# Patient Record
Sex: Male | Born: 1965 | Race: White | Hispanic: No | Marital: Married | Smoking: Current every day smoker
Health system: Southern US, Community
[De-identification: ages and names within clinical notes are randomized; demographics above are authoritative.]

## PROBLEM LIST (undated history)

## (undated) DIAGNOSIS — K5792 Diverticulitis of intestine, part unspecified, without perforation or abscess without bleeding: Secondary | ICD-10-CM

## (undated) DIAGNOSIS — K219 Gastro-esophageal reflux disease without esophagitis: Secondary | ICD-10-CM

## (undated) DIAGNOSIS — J449 Chronic obstructive pulmonary disease, unspecified: Secondary | ICD-10-CM

## (undated) DIAGNOSIS — Z860100 Personal history of colon polyps, unspecified: Secondary | ICD-10-CM

## (undated) DIAGNOSIS — J439 Emphysema, unspecified: Secondary | ICD-10-CM

## (undated) DIAGNOSIS — I10 Essential (primary) hypertension: Secondary | ICD-10-CM

## (undated) DIAGNOSIS — I714 Abdominal aortic aneurysm, without rupture: Secondary | ICD-10-CM

## (undated) HISTORY — PX: VASECTOMY: SHX75

## (undated) HISTORY — DX: Emphysema, unspecified: J43.9

## (undated) HISTORY — DX: Personal history of colon polyps, unspecified: Z86.0100

## (undated) HISTORY — PX: ESOPHAGOGASTRODUODENOSCOPY: SHX1529

## (undated) HISTORY — DX: Chronic obstructive pulmonary disease, unspecified: J44.9

## (undated) HISTORY — PX: LAPAROSCOPIC SIGMOID COLECTOMY: SHX5928

## (undated) HISTORY — DX: Essential (primary) hypertension: I10

## (undated) HISTORY — PX: COLECTOMY: SHX59

## (undated) HISTORY — DX: Personal history of colonic polyps: Z86.010

---
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
## 2007-01-29 ENCOUNTER — Ambulatory Visit: Payer: Self-pay | Admitting: Family Medicine

## 2007-01-29 DIAGNOSIS — F17200 Nicotine dependence, unspecified, uncomplicated: Secondary | ICD-10-CM

## 2007-01-29 DIAGNOSIS — K219 Gastro-esophageal reflux disease without esophagitis: Secondary | ICD-10-CM

## 2007-01-29 DIAGNOSIS — E782 Mixed hyperlipidemia: Secondary | ICD-10-CM | POA: Insufficient documentation

## 2007-02-05 ENCOUNTER — Encounter: Payer: Self-pay | Admitting: Family Medicine

## 2007-02-08 ENCOUNTER — Encounter: Payer: Self-pay | Admitting: Family Medicine

## 2007-02-08 LAB — CONVERTED CEMR LAB
ALT: 14 units/L (ref 0–53)
Albumin: 4.4 g/dL (ref 3.5–5.2)
Alkaline Phosphatase: 84 units/L (ref 39–117)
CO2: 20 meq/L (ref 19–32)
Cholesterol: 213 mg/dL — ABNORMAL HIGH (ref 0–200)
Glucose, Bld: 100 mg/dL — ABNORMAL HIGH (ref 70–99)
LDL Cholesterol: 150 mg/dL — ABNORMAL HIGH (ref 0–99)
Potassium: 4.3 meq/L (ref 3.5–5.3)
Sodium: 142 meq/L (ref 135–145)
Total Protein: 7 g/dL (ref 6.0–8.3)
Triglycerides: 134 mg/dL (ref <150)

## 2007-02-12 ENCOUNTER — Telehealth: Payer: Self-pay | Admitting: Family Medicine

## 2011-10-28 HISTORY — PX: COLECTOMY: SHX59

## 2012-04-09 HISTORY — PX: COLONOSCOPY: SHX174

## 2012-04-09 LAB — HM COLONOSCOPY

## 2012-04-13 LAB — HM COLONOSCOPY

## 2015-01-30 ENCOUNTER — Emergency Department (HOSPITAL_BASED_OUTPATIENT_CLINIC_OR_DEPARTMENT_OTHER): Payer: 59

## 2015-01-30 ENCOUNTER — Emergency Department (HOSPITAL_BASED_OUTPATIENT_CLINIC_OR_DEPARTMENT_OTHER)
Admission: EM | Admit: 2015-01-30 | Discharge: 2015-01-31 | Disposition: A | Discharge status: Home / Self Care | Payer: 59 | Attending: Emergency Medicine | Admitting: Emergency Medicine

## 2015-01-30 ENCOUNTER — Encounter (HOSPITAL_BASED_OUTPATIENT_CLINIC_OR_DEPARTMENT_OTHER): Payer: Self-pay

## 2015-01-30 DIAGNOSIS — R067 Sneezing: Secondary | ICD-10-CM | POA: Insufficient documentation

## 2015-01-30 DIAGNOSIS — K219 Gastro-esophageal reflux disease without esophagitis: Secondary | ICD-10-CM | POA: Diagnosis not present

## 2015-01-30 DIAGNOSIS — M549 Dorsalgia, unspecified: Secondary | ICD-10-CM | POA: Insufficient documentation

## 2015-01-30 DIAGNOSIS — R109 Unspecified abdominal pain: Secondary | ICD-10-CM | POA: Insufficient documentation

## 2015-01-30 DIAGNOSIS — Z79899 Other long term (current) drug therapy: Secondary | ICD-10-CM | POA: Diagnosis not present

## 2015-01-30 DIAGNOSIS — Z720 Tobacco use: Secondary | ICD-10-CM | POA: Insufficient documentation

## 2015-01-30 DIAGNOSIS — R05 Cough: Secondary | ICD-10-CM | POA: Insufficient documentation

## 2015-01-30 HISTORY — DX: Diverticulitis of intestine, part unspecified, without perforation or abscess without bleeding: K57.92

## 2015-01-30 HISTORY — DX: Gastro-esophageal reflux disease without esophagitis: K21.9

## 2015-01-30 LAB — URINALYSIS, ROUTINE W REFLEX MICROSCOPIC
Bilirubin Urine: NEGATIVE
GLUCOSE, UA: NEGATIVE mg/dL
Hgb urine dipstick: NEGATIVE
Ketones, ur: NEGATIVE mg/dL
LEUKOCYTES UA: NEGATIVE
Nitrite: NEGATIVE
Protein, ur: NEGATIVE mg/dL
Specific Gravity, Urine: 1.025 (ref 1.005–1.030)
UROBILINOGEN UA: 1 mg/dL (ref 0.0–1.0)
pH: 6 (ref 5.0–8.0)

## 2015-01-30 MED ORDER — IBUPROFEN 800 MG PO TABS
800.0000 mg | ORAL_TABLET | Freq: Once | ORAL | Status: AC
  Administered 2015-01-30: 800 mg via ORAL
  Filled 2015-01-30: qty 1

## 2015-01-30 NOTE — ED Notes (Signed)
Unable to void at this time.

## 2015-01-30 NOTE — ED Notes (Signed)
Patient reports right flank pain which began last night.  Reports previous to this he began having problems with his allergies which started last Thursday.  Patient reports he was coughing and began having pain in the left flank but this has since moved to his right side.  Patient reports difficulty with urination.  Denies painful urination.

## 2015-01-30 NOTE — ED Notes (Addendum)
Left flank pain last week-right flank pain with less UO x 2 days-denies n/v/d

## 2015-01-30 NOTE — ED Provider Notes (Signed)
CSN: [id number]     Arrival date & time 01/30/15  1949 History  This chart was scribed for Delora Fuel, MD by Tula Nakayama, ED Scribe. This patient was seen in room MH07/MH07 and the patient's care was started at 11:21 PM.    Chief Complaint  Patient presents with  . Flank Pain   The history is provided by the patient. No language interpreter was used.    HPI Comments: Cody Flores is a 49 y.o. male who presents to the Emergency Department complaining of intermittent, gradually worsening, 3/10 right flank pain that started 2 days ago. Pt reports productive cough with green phlegm as an associated symptom. He states pain becomes worse with movements, sneezing and coughing. He denies worsening symptoms with deep breaths. Pt has tried Hydrocodone-325 with some relief. Pt reports a similar, shooting pain on his left flank that occurred last week and lasted 3 days. He notes onset of left-sided pain occurred after sneezing multiple times. Pt smokes 1 ppd. He denies nausea, vomiting and dysuria as associated symptoms.  No PCP  Past Medical History  Diagnosis Date  . Diverticulitis   . GERD (gastroesophageal reflux disease)    Past Surgical History  Procedure Laterality Date  . Colectomy    . Vasectomy     No family history on file. History  Substance Use Topics  . Smoking status: Current Every Day Smoker  . Smokeless tobacco: Not on file  . Alcohol Use: No    Review of Systems  Respiratory: Positive for cough.   Gastrointestinal: Negative for nausea and vomiting.  Genitourinary: Positive for flank pain. Negative for dysuria.  All other systems reviewed and are negative.   Allergies  Review of patient's allergies indicates no known allergies.  Home Medications   Prior to Admission medications   Medication Sig Start Date End Date Taking? Authorizing Provider  Lansoprazole (PREVACID PO) Take by mouth.   Yes Historical Provider, MD   BP 162/76 mmHg  Pulse 58  Temp(Src) 97.9  F (36.6 C) (Oral)  Resp 18  Ht 5\' 10"  (1.778 m)  Wt 200 lb (90.719 kg)  BMI 28.70 kg/m2  SpO2 96% Physical Exam  Constitutional: He is oriented to person, place, and time. He appears well-developed and well-nourished. No distress.  HENT:  Head: Normocephalic and atraumatic.  Eyes: Conjunctivae and EOM are normal. Pupils are equal, round, and reactive to light.  Neck: Normal range of motion. Neck supple. No JVD present.  Cardiovascular: Normal rate, regular rhythm and normal heart sounds.   No murmur heard. Pulmonary/Chest: Effort normal and breath sounds normal. No stridor. He has no wheezes. He has no rales. He exhibits tenderness.  Mild tenderness right lower rib cage posteriorly  Abdominal: Soft. Bowel sounds are normal. He exhibits no distension and no mass. There is no tenderness.  Musculoskeletal: Normal range of motion. He exhibits no edema.  Lymphadenopathy:    He has no cervical adenopathy.  Neurological: He is alert and oriented to person, place, and time. No cranial nerve deficit. He exhibits normal muscle tone. Coordination normal.  Skin: Skin is warm and dry. No rash noted.  Psychiatric: He has a normal mood and affect. His behavior is normal. Judgment and thought content normal.  Nursing note and vitals reviewed.   ED Course  Procedures   DIAGNOSTIC STUDIES: Oxygen Saturation is 96% on RA, normal by my interpretation.    COORDINATION OF CARE: 11:25 PM Discussed UA results. Discussed treatment plan with pt which includes chest  x-ray. He agreed to plan.   Labs Review Results for orders placed or performed during the hospital encounter of 01/30/15  Urinalysis, Routine w reflex microscopic  Result Value Ref Range   Color, Urine YELLOW YELLOW   APPearance CLEAR CLEAR   Specific Gravity, Urine 1.025 1.005 - 1.030   pH 6.0 5.0 - 8.0   Glucose, UA NEGATIVE NEGATIVE mg/dL   Hgb urine dipstick NEGATIVE NEGATIVE   Bilirubin Urine NEGATIVE NEGATIVE   Ketones, ur  NEGATIVE NEGATIVE mg/dL   Protein, ur NEGATIVE NEGATIVE mg/dL   Urobilinogen, UA 1.0 0.0 - 1.0 mg/dL   Nitrite NEGATIVE NEGATIVE   Leukocytes, UA NEGATIVE NEGATIVE   Imaging Review Dg Chest 2 View  01/30/2015   CLINICAL DATA:  Right flank pain.  EXAM: CHEST  2 VIEW  COMPARISON:  None.  FINDINGS: The heart size and mediastinal contours are within normal limits. Both lungs are clear. The visualized skeletal structures are unremarkable.  IMPRESSION: No active cardiopulmonary disease.   Electronically Signed   By: Lucienne Capers M.D.   On: 01/30/2015 23:57     MDM   Final diagnoses:  Right flank pain    Right flank pain which appears to be musculoskeletal. I suspect injury from harsh coughing. Chest x-ray is unremarkable and urinalysis is normal. He is dischargedwith a prescription for hydrocodone-acetaminophen to take as needed for pain and return to ED if symptoms worsen. Advised to use over-the-counter NSAIDs as first-line treatment.  I personally performed the services described in this documentation, which was scribed in my presence. The recorded information has been reviewed and is accurate.     Delora Fuel, MD 47/34/03 [id number]

## 2015-01-31 MED ORDER — HYDROCODONE-ACETAMINOPHEN 5-325 MG PO TABS: 1.0000 | ORAL_TABLET | ORAL | Status: DC | PRN

## 2015-01-31 NOTE — Discharge Instructions (Signed)
Take ibuprofen or naproxen for less severe pain. Return if symptoms are getting worse.  Acetaminophen; Hydrocodone tablets or capsules What is this medicine? ACETAMINOPHEN; HYDROCODONE (a set a MEE noe fen; hye droe KOE done) is a pain reliever. It is used to treat mild to moderate pain. This medicine may be used for other purposes; ask your health care provider or pharmacist if you have questions. COMMON BRAND NAME(S): Anexsia, Bancap HC, Ceta-Plus, Co-Gesic, Comfortpak, Dolagesic, [employer], DuoCet, Hydrocet, Hydrogesic, [place], Lorcet HD, Lorcet Plus, Lortab, Margesic H, Maxidone, [place], Polygesic, [place], [place], [place], Vicodin, Vicodin ES, Vicodin HP, Charlane Ferretti What should I tell my health care provider before I take this medicine? They need to know if you have any of these conditions: -brain tumor -Crohn's disease, inflammatory bowel disease, or ulcerative colitis -drug abuse or addiction -head injury -heart or circulation problems -if you often drink alcohol -kidney disease or problems going to the bathroom -liver disease -lung disease, asthma, or breathing problems -an unusual or allergic reaction to acetaminophen, hydrocodone, other opioid analgesics, other medicines, foods, dyes, or preservatives -pregnant or trying to get pregnant -breast-feeding How should I use this medicine? Take this medicine by mouth. Swallow it with a full glass of water. Follow the directions on the prescription label. If the medicine upsets your stomach, take the medicine with food or milk. Do not take more than you are told to take. Talk to your pediatrician regarding the use of this medicine in children. This medicine is not approved for use in children. Overdosage: If you think you have taken too much of this medicine contact a poison control center or emergency room at once. NOTE: This medicine is only for you. Do not share this medicine with others. What if I miss a dose? If you miss  a dose, take it as soon as you can. If it is almost time for your next dose, take only that dose. Do not take double or extra doses. What may interact with this medicine? -alcohol -antihistamines -isoniazid -medicines for depression, anxiety, or psychotic disturbances -medicines for sleep -muscle relaxants -naltrexone -narcotic medicines (opiates) for pain -phenobarbital -ritonavir -tramadol This list may not describe all possible interactions. Give your health care provider a list of all the medicines, herbs, non-prescription drugs, or dietary supplements you use. Also tell them if you smoke, drink alcohol, or use illegal drugs. Some items may interact with your medicine. What should I watch for while using this medicine? Tell your doctor or health care professional if your pain does not go away, if it gets worse, or if you have new or a different type of pain. You may develop tolerance to the medicine. Tolerance means that you will need a higher dose of the medicine for pain relief. Tolerance is normal and is expected if you take the medicine for a long time. Do not suddenly stop taking your medicine because you may develop a severe reaction. Your body becomes used to the medicine. This does NOT mean you are addicted. Addiction is a behavior related to getting and using a drug for a non-medical reason. If you have pain, you have a medical reason to take pain medicine. Your doctor will tell you how much medicine to take. If your doctor wants you to stop the medicine, the dose will be slowly lowered over time to avoid any side effects. You may get drowsy or dizzy when you first start taking the medicine or change doses. Do not drive, use machinery, or do  anything that may be dangerous until you know how the medicine affects you. Stand or sit up slowly. There are different types of narcotic medicines (opiates) for pain. If you take more than one type at the same time, you may have more side effects.  Give your health care provider a list of all medicines you use. Your doctor will tell you how much medicine to take. Do not take more medicine than directed. Call emergency for help if you have problems breathing. The medicine will cause constipation. Try to have a bowel movement at least every 2 to 3 days. If you do not have a bowel movement for 3 days, call your doctor or health care professional. Too much acetaminophen can be very dangerous. Do not take Tylenol (acetaminophen) or medicines that contain acetaminophen with this medicine. Many non-prescription medicines contain acetaminophen. Always read the labels carefully. What side effects may I notice from receiving this medicine? Side effects that you should report to your doctor or health care professional as soon as possible: -allergic reactions like skin rash, itching or hives, swelling of the face, lips, or tongue -breathing problems -confusion -feeling faint or lightheaded, falls -stomach pain -yellowing of the eyes or skin Side effects that usually do not require medical attention (report to your doctor or health care professional if they continue or are bothersome): -nausea, vomiting -stomach upset This list may not describe all possible side effects. Call your doctor for medical advice about side effects. You may report side effects to FDA at 1-800-FDA-1088. Where should I keep my medicine? Keep out of the reach of children. This medicine can be abused. Keep your medicine in a safe place to protect it from theft. Do not share this medicine with anyone. Selling or giving away this medicine is dangerous and against the law. Store at room temperature between 15 and 30 degrees C (59 and 86 degrees F). Protect from light. Keep container tightly closed. Throw away any unused medicine after the expiration date. Discard unused medicine and used packaging carefully. Pets and children can be harmed if they find used or lost packages. NOTE:  This sheet is a summary. It may not cover all possible information. If you have questions about this medicine, talk to your doctor, pharmacist, or health care provider.  2015, Elsevier/Gold Standard. (2013-06-06 13:15:56)

## 2016-01-03 ENCOUNTER — Encounter: Payer: Self-pay | Admitting: Family Medicine

## 2016-01-03 ENCOUNTER — Ambulatory Visit (INDEPENDENT_AMBULATORY_CARE_PROVIDER_SITE_OTHER): Payer: 59 | Admitting: Family Medicine

## 2016-01-03 VITALS — BP 141/81 | HR 57 | Ht 69.0 in | Wt 207.0 lb

## 2016-01-03 DIAGNOSIS — E669 Obesity, unspecified: Secondary | ICD-10-CM | POA: Diagnosis not present

## 2016-01-03 DIAGNOSIS — Z23 Encounter for immunization: Secondary | ICD-10-CM | POA: Diagnosis not present

## 2016-01-03 DIAGNOSIS — Z9049 Acquired absence of other specified parts of digestive tract: Secondary | ICD-10-CM | POA: Insufficient documentation

## 2016-01-03 DIAGNOSIS — E782 Mixed hyperlipidemia: Secondary | ICD-10-CM

## 2016-01-03 DIAGNOSIS — J449 Chronic obstructive pulmonary disease, unspecified: Secondary | ICD-10-CM | POA: Insufficient documentation

## 2016-01-03 DIAGNOSIS — J439 Emphysema, unspecified: Secondary | ICD-10-CM | POA: Diagnosis not present

## 2016-01-03 DIAGNOSIS — R0602 Shortness of breath: Secondary | ICD-10-CM | POA: Diagnosis not present

## 2016-01-03 DIAGNOSIS — F17200 Nicotine dependence, unspecified, uncomplicated: Secondary | ICD-10-CM

## 2016-01-03 DIAGNOSIS — L853 Xerosis cutis: Secondary | ICD-10-CM | POA: Insufficient documentation

## 2016-01-03 DIAGNOSIS — Z1211 Encounter for screening for malignant neoplasm of colon: Secondary | ICD-10-CM

## 2016-01-03 DIAGNOSIS — L821 Other seborrheic keratosis: Secondary | ICD-10-CM

## 2016-01-03 LAB — LIPID PANEL
CHOL/HDL RATIO: 6 ratio — AB (ref <=5.0)
CHOLESTEROL: 217 mg/dL — AB (ref 125–200)
HDL: 36 mg/dL — AB (ref >=40)
LDL Cholesterol: 160 mg/dL — ABNORMAL HIGH (ref <130)
TRIGLYCERIDES: 107 mg/dL (ref <150)
VLDL: 21 mg/dL (ref <30)

## 2016-01-03 LAB — COMPREHENSIVE METABOLIC PANEL
ALBUMIN: 4.4 g/dL (ref 3.6–5.1)
ALK PHOS: 69 U/L (ref 40–115)
ALT: 18 U/L (ref 9–46)
AST: 18 U/L (ref 10–35)
BILIRUBIN TOTAL: 0.5 mg/dL (ref 0.2–1.2)
BUN: 19 mg/dL (ref 7–25)
CALCIUM: 9.4 mg/dL (ref 8.6–10.3)
CO2: 26 mmol/L (ref 20–31)
Chloride: 100 mmol/L (ref 98–110)
Creat: 1.06 mg/dL (ref 0.70–1.33)
GLUCOSE: 98 mg/dL (ref 65–99)
POTASSIUM: 4.1 mmol/L (ref 3.5–5.3)
Sodium: 137 mmol/L (ref 135–146)
TOTAL PROTEIN: 7 g/dL (ref 6.1–8.1)

## 2016-01-03 LAB — CBC
HEMATOCRIT: 46.1 % (ref 39.0–52.0)
HEMOGLOBIN: 15.3 g/dL (ref 13.0–17.0)
MCH: 28.1 pg (ref 26.0–34.0)
MCHC: 33.2 g/dL (ref 30.0–36.0)
MCV: 84.7 fL (ref 78.0–100.0)
MPV: 10 fL (ref 8.6–12.4)
Platelets: 237 10*3/uL (ref 150–400)
RBC: 5.44 MIL/uL (ref 4.22–5.81)
RDW: 14.5 % (ref 11.5–15.5)
WBC: 6.2 10*3/uL (ref 4.0–10.5)

## 2016-01-03 LAB — TSH: TSH: 1.45 mIU/L (ref 0.40–4.50)

## 2016-01-03 MED ORDER — UMECLIDINIUM-VILANTEROL 62.5-25 MCG/INH IN AEPB: 1.0000 | INHALATION_SPRAY | Freq: Every day | RESPIRATORY_TRACT | Status: DC

## 2016-01-03 MED ORDER — ALBUTEROL SULFATE (2.5 MG/3ML) 0.083% IN NEBU
2.5000 mg | INHALATION_SOLUTION | Freq: Once | RESPIRATORY_TRACT | Status: AC
  Administered 2016-01-03: 2.5 mg via RESPIRATORY_TRACT

## 2016-01-03 MED ORDER — VARENICLINE TARTRATE 0.5 MG X 11 & 1 MG X 42 PO MISC: ORAL | Status: DC

## 2016-01-03 MED ORDER — TRIAMCINOLONE ACETONIDE 0.1 % EX CREA: 1.0000 "application " | TOPICAL_CREAM | Freq: Two times a day (BID) | CUTANEOUS | Status: DC

## 2016-01-03 MED FILL — CHANTIX STARTING MONTH BOX: 0.5 MG X 11 | 28 days supply | Qty: 53 | Fill #0

## 2016-01-03 MED FILL — TRIAMCINOLONE 0.1% CREAM: 0.1 | 30 days supply | Qty: 454 | Fill #0

## 2016-01-03 MED FILL — ANORO ELLIPTA 62.5-25 MCG I: 62.5-25 | 30 days supply | Qty: 60 | Fill #0

## 2016-01-03 NOTE — Progress Notes (Signed)
Cody Flores is a 50 y.o. male who presents to [facility] today for establish care and discuss shortness of breath smoking colon cancer screening calf soreness and lesion on left calf.  1) shortness of breath: Patient is a current smoker and has been for many years. He has attempted to quit smoking in the past with patches that did not work. He is very eager and willing to quit smoking. His father has terrible end-stage COPD and he would like to avoid that fate. Patient notes that he has some shortness of breath with exertion. He denies any chest pain or palpitations with this. He has difficulty climbing more than 2 or 3 flights of stairs without stopping. He does note some wheezing and coughing regularly. Today his lungs feel about as well as they normally do.  2) colonoscopy: Patient has a history of diverticulitis and had a laparoscopic sigmoid colectomy. He notes that he is due for his repeat colonoscopy. He feels well with no GI upset diarrhea or blood in stool. His last colonoscopy was done somewhere here near [place]. Currently he has to [facility] and he would like it done at a [place] group practice.  3) dry skin: Patient notes dry skin chronically. He does not use much treatment for this. He notes is mildly bothersome.  4) skin lesion left posterior calf present for a year. He notes a dry scab. It does not bleed and itch or hurt her as painful. It is not changing.   Past Medical History  Diagnosis Date  . Diverticulitis    Past Surgical History  Procedure Laterality Date  . Laparoscopic sigmoid colectomy     Social History  Substance Use Topics  . Smoking status: Current Every Day Smoker  . Smokeless tobacco: Not on file  . Alcohol Use: 0.0 oz/week    0 Standard drinks or equivalent per week   family history includes Aneurysm in his father;  Cancer in his father; Diabetes in his maternal grandmother.  ROS as above Medications: Current Outpatient Prescriptions  Medication Sig Dispense Refill  . lansoprazole (PREVACID) 30 MG capsule Take 30 mg by mouth daily at 12 noon.    . varenicline (CHANTIX STARTING MONTH PAK) 0.5 MG X 11 & 1 MG X 42 tablet Take one 0.5mg  tablet by mouth once daily for 3 days, then increase to one 0.5mg  tablet twice daily for 3 days, then increase to one 1mg  tablet twice daily. 53 tablet 0   No current facility-administered medications for this visit.   No Known Allergies   Exam:  BP 141/81 mmHg  Pulse 57  Ht 5\' 9"  (1.753 m)  Wt 207 lb (93.895 kg)  BMI 30.55 kg/m2 Gen: Well NAD HEENT: EOMI,  MMM Lungs: Normal work of breathing. CTABL Heart: RRR no MRG Abd: NABS, Soft. Nondistended, Nontender Exts: Brisk capillary refill, warm and well perfused. Calves are nontender. Pulses capillary refill sensation intact distally. Skin: Dry occasional erythematous macular rash across trunk. Nontender. Blanchable. He has 1 macular hyperpigmented bruised appearing area on his left back to its been chronic and present for years. On his left posterior calf he has a raised dry scaly small less than 1 cm lesion consistent with a dry seborrheic keratosis.   Spirometry:    PFT INTERPRETATION  FEV1/FVC >70 *AND*  FEV1 >80% predicted  = NORMAL SPIROMETRY NORMAL? no  1. Valid study? yes 2. 3. FEV1/FVC: 53%  >70 =  Normal  <70 = Obstructive    = COPD yes 3. Severity of Obstruction ~ Post-Bronchodilator FEV1: 62% of pred  FEV1 80+ = GOLD Stage I = Mild  FEV1 50-79 = GOLD Stage II = Moderate  FEV1 30-49 = GOLD Stage III = Severe  FEV1 <30 = GOLD Stage IV = Very Severe 5. Bronchodilator Challenge  Increase FEV1 or FVC by 200+mL? no *AND*  Increased same FEV1 or FVC by 12+%? no  Reversible? no 6. FVC <80% = Restrictive no 7. Lung Volumes  TLC, VC, RV Normal = 80-120%  TLC <80% = Restrictive  TLC >120% =  Hyperinflation  RV >120% = Air Trapping  RV <80% = RLD or OLD  VC <80% = RLD or OLD 8. Diffusion Capacity - refer to Pulm needed? no If suspect interstitial lung disease   No results found for this or any previous visit (from the past 24 hour(s)). No results found.   Please see individual assessment and plan sections.

## 2016-01-03 NOTE — Assessment & Plan Note (Signed)
We had a lengthy discussion about options. Plan to try Chantix. Recheck in one month.

## 2016-01-03 NOTE — Assessment & Plan Note (Signed)
Check lipids 

## 2016-01-03 NOTE — Patient Instructions (Addendum)
Thank you for coming in today. You should hear from [facility] soon about the colonscopy.  Start Chantix to quit smoking.  You can set a quit date or use the gradual quit method.  Return in 1 month or so.  Chantix works best when it is taken for at least 3 months.  Get fasting labs today.  Use the cream twice daily as needed.  I think you have COPD.  This is lung damage from smoking.  The most important thing you can do for your lungs is to quit smoking.  Use the inhaler daily.    Chronic Obstructive Pulmonary Disease Chronic obstructive pulmonary disease (COPD) is a common lung condition in which airflow from the lungs is limited. COPD is a general term that can be used to describe many different lung problems that limit airflow, including both chronic bronchitis and emphysema. If you have COPD, your lung function will probably never return to normal, but there are measures you can take to improve lung function and make yourself feel better. CAUSES   Smoking (common).  Exposure to secondhand smoke.  Genetic problems.  Chronic inflammatory lung diseases or recurrent infections. SYMPTOMS  Shortness of breath, especially with physical activity.  Deep, persistent (chronic) cough with a large amount of thick mucus.  Wheezing.  Rapid breaths (tachypnea).  Gray or bluish discoloration (cyanosis) of the skin, especially in your fingers, toes, or lips.  Fatigue.  Weight loss.  Frequent infections or episodes when breathing symptoms become much worse (exacerbations).  Chest tightness. DIAGNOSIS Your health care provider will take a medical history and perform a physical examination to diagnose COPD. Additional tests for COPD may include:  Lung (pulmonary) function tests.  Chest X-ray.  CT scan.  Blood tests. TREATMENT  Treatment for COPD may include:  Inhaler and nebulizer medicines. These help manage the symptoms of COPD and make your breathing more  comfortable.  Supplemental oxygen. Supplemental oxygen is only helpful if you have a low oxygen level in your blood.  Exercise and physical activity. These are beneficial for nearly all people with COPD.  Lung surgery or transplant.  Nutrition therapy to gain weight, if you are underweight.  Pulmonary rehabilitation. This may involve working with a team of health care providers and specialists, such as respiratory, occupational, and physical therapists. HOME CARE INSTRUCTIONS  Take all medicines (inhaled or pills) as directed by your health care provider.  Avoid over-the-counter medicines or cough syrups that dry up your airway (such as antihistamines) and slow down the elimination of secretions unless instructed otherwise by your health care provider.  If you are a smoker, the most important thing that you can do is stop smoking. Continuing to smoke will cause further lung damage and breathing trouble. Ask your health care provider for help with quitting smoking. He or she can direct you to community resources or hospitals that provide support.  Avoid exposure to irritants such as smoke, chemicals, and fumes that aggravate your breathing.  Use oxygen therapy and pulmonary rehabilitation if directed by your health care provider. If you require home oxygen therapy, ask your health care provider whether you should purchase a pulse oximeter to measure your oxygen level at home.  Avoid contact with individuals who have a contagious illness.  Avoid extreme temperature and humidity changes.  Eat healthy foods. Eating smaller, more frequent meals and resting before meals may help you maintain your strength.  Stay active, but balance activity with periods of rest. Exercise and physical activity  will help you maintain your ability to do things you want to do.  Preventing infection and hospitalization is very important when you have COPD. Make sure to receive all the vaccines your health care  provider recommends, especially the pneumococcal and influenza vaccines. Ask your health care provider whether you need a pneumonia vaccine.  Learn and use relaxation techniques to manage stress.  Learn and use controlled breathing techniques as directed by your health care provider. Controlled breathing techniques include:  Pursed lip breathing. Start by breathing in (inhaling) through your nose for 1 second. Then, purse your lips as if you were going to whistle and breathe out (exhale) through the pursed lips for 2 seconds.  Diaphragmatic breathing. Start by putting one hand on your abdomen just above your waist. Inhale slowly through your nose. The hand on your abdomen should move out. Then purse your lips and exhale slowly. You should be able to feel the hand on your abdomen moving in as you exhale.  Learn and use controlled coughing to clear mucus from your lungs. Controlled coughing is a series of short, progressive coughs. The steps of controlled coughing are: 1. Lean your head slightly forward. 2. Breathe in deeply using diaphragmatic breathing. 3. Try to hold your breath for 3 seconds. 4. Keep your mouth slightly open while coughing twice. 5. Spit any mucus out into a tissue. 6. Rest and repeat the steps once or twice as needed. SEEK MEDICAL CARE IF:  You are coughing up more mucus than usual.  There is a change in the color or thickness of your mucus.  Your breathing is more labored than usual.  Your breathing is faster than usual. SEEK IMMEDIATE MEDICAL CARE IF:  You have shortness of breath while you are resting.  You have shortness of breath that prevents you from:  Being able to talk.  Performing your usual physical activities.  You have chest pain lasting longer than 5 minutes.  Your skin color is more cyanotic than usual.  You measure low oxygen saturations for longer than 5 minutes with a pulse oximeter. MAKE SURE YOU:  Understand these  instructions.  Will watch your condition.  Will get help right away if you are not doing well or get worse.   This information is not intended to replace advice given to you by your health care provider. Make sure you discuss any questions you have with your health care provider.   Document Released: 07/23/2005 Document Revised: 11/03/2014 Document Reviewed: 06/09/2013 Elsevier Interactive Patient Education [employer].

## 2016-01-03 NOTE — Assessment & Plan Note (Signed)
Dry skin treat with triamcinolone compounded with Eucerin as needed.

## 2016-01-03 NOTE — Assessment & Plan Note (Signed)
Routine fasting blood work

## 2016-01-03 NOTE — Assessment & Plan Note (Signed)
Gold stage II. Functionally limited.  Plan to start Anoro inhaler.  Recheck in 1 month.  Quit smoking today.

## 2016-01-03 NOTE — Assessment & Plan Note (Signed)
Left posterior calf. Watchful waiting.

## 2016-01-03 NOTE — Assessment & Plan Note (Signed)
Refer to [facility] gastroenterology for colonoscopy.

## 2016-01-04 ENCOUNTER — Encounter: Payer: Self-pay | Admitting: Family Medicine

## 2016-01-04 DIAGNOSIS — E559 Vitamin D deficiency, unspecified: Secondary | ICD-10-CM | POA: Insufficient documentation

## 2016-01-04 DIAGNOSIS — R7303 Prediabetes: Secondary | ICD-10-CM | POA: Insufficient documentation

## 2016-01-04 LAB — HEMOGLOBIN A1C
HEMOGLOBIN A1C: 6.1 % — AB (ref <5.7)
Mean Plasma Glucose: 128 mg/dL — ABNORMAL HIGH (ref <117)

## 2016-01-04 LAB — VITAMIN D 25 HYDROXY (VIT D DEFICIENCY, FRACTURES): VIT D 25 HYDROXY: 17 ng/mL — AB (ref 30–100)

## 2016-01-04 MED ORDER — ATORVASTATIN CALCIUM 20 MG PO TABS: 20.0000 mg | ORAL_TABLET | Freq: Every day | ORAL | Status: DC

## 2016-01-04 NOTE — Progress Notes (Signed)
Quick Note:  Cholesterol is bad. Plan to start lipitor.  Cody Flores has pre-diabetes. We will address diet and weight loss when quit smoking.  Vitamin D deficiency noted. Take 5000 units of vitamin D daily over-the-counter.   ______

## 2016-01-04 NOTE — Addendum Note (Signed)
Addended by: Gregor Hams on: 01/04/2016 07:21 AM   Modules accepted: Orders

## 2016-01-31 ENCOUNTER — Ambulatory Visit (INDEPENDENT_AMBULATORY_CARE_PROVIDER_SITE_OTHER): Payer: 59 | Admitting: Family Medicine

## 2016-01-31 ENCOUNTER — Encounter: Payer: Self-pay | Admitting: Family Medicine

## 2016-01-31 VITALS — BP 127/84 | HR 62 | Wt 211.0 lb

## 2016-01-31 DIAGNOSIS — J439 Emphysema, unspecified: Secondary | ICD-10-CM | POA: Diagnosis not present

## 2016-01-31 DIAGNOSIS — F17200 Nicotine dependence, unspecified, uncomplicated: Secondary | ICD-10-CM

## 2016-01-31 DIAGNOSIS — R7303 Prediabetes: Secondary | ICD-10-CM

## 2016-01-31 DIAGNOSIS — E782 Mixed hyperlipidemia: Secondary | ICD-10-CM | POA: Diagnosis not present

## 2016-01-31 MED ORDER — VARENICLINE TARTRATE 1 MG PO TABS: 1.0000 mg | ORAL_TABLET | Freq: Two times a day (BID) | ORAL | Status: DC

## 2016-01-31 MED FILL — CHANTIX 1 MG TABLET: 1 | 30 days supply | Qty: 60 | Fill #0

## 2016-01-31 NOTE — Patient Instructions (Signed)
Thank you for coming in today. Call [facility] about colonoscopy [phone] Continue Chantix Start Lipitor Get Anoro refilled.  Return in 2-3 months.   Atorvastatin tablets What is this medicine? ATORVASTATIN (a TORE va sta tin) is known as a HMG-CoA reductase inhibitor or 'statin'. It lowers the level of cholesterol and triglycerides in the blood. This drug may also reduce the risk of heart attack, stroke, or other health problems in patients with risk factors for heart disease. Diet and lifestyle changes are often used with this drug. This medicine may be used for other purposes; ask your health care provider or pharmacist if you have questions. What should I tell my health care provider before I take this medicine? They need to know if you have any of these conditions: -frequently drink alcoholic beverages -history of stroke, TIA -kidney disease -liver disease -muscle aches or weakness -other medical condition -an unusual or allergic reaction to atorvastatin, other medicines, foods, dyes, or preservatives -pregnant or trying to get pregnant -breast-feeding How should I use this medicine? Take this medicine by mouth with a glass of water. Follow the directions on the prescription label. You can take this medicine with or without food. Take your doses at regular intervals. Do not take your medicine more often than directed. Talk to your pediatrician regarding the use of this medicine in children. While this drug may be prescribed for children as young as 94 years old for selected conditions, precautions do apply. Overdosage: If you think you have taken too much of this medicine contact a poison control center or emergency room at once. NOTE: This medicine is only for you. Do not share this medicine with others. What if I miss a dose? If you miss a dose, take it as soon as you can. If it is almost time for your next dose, take only that dose. Do not take double or extra doses. What may  interact with this medicine? Do not take this medicine with any of the following medications: -red yeast rice -telaprevir -telithromycin -voriconazole This medicine may also interact with the following medications: -alcohol -antiviral medicines for HIV or AIDS -boceprevir -certain antibiotics like clarithromycin, erythromycin, troleandomycin -certain medicines for cholesterol like fenofibrate or gemfibrozil -cimetidine -clarithromycin -colchicine -cyclosporine -digoxin -male hormones, like estrogens or progestins and birth control pills -grapefruit juice -medicines for fungal infections like fluconazole, itraconazole, ketoconazole -niacin -rifampin -spironolactone This list may not describe all possible interactions. Give your health care provider a list of all the medicines, herbs, non-prescription drugs, or dietary supplements you use. Also tell them if you smoke, drink alcohol, or use illegal drugs. Some items may interact with your medicine. What should I watch for while using this medicine? Visit your doctor or health care professional for regular check-ups. You may need regular tests to make sure your liver is working properly. Tell your doctor or health care professional right away if you get any unexplained muscle pain, tenderness, or weakness, especially if you also have a fever and tiredness. Your doctor or health care professional may tell you to stop taking this medicine if you develop muscle problems. If your muscle problems do not go away after stopping this medicine, contact your health care professional. This drug is only part of a total heart-health program. Your doctor or a dietician can suggest a low-cholesterol and low-fat diet to help. Avoid alcohol and smoking, and keep a proper exercise schedule. Do not use this drug if you are pregnant or breast-feeding. Serious side effects to  an unborn child or to an infant are possible. Talk to your doctor or pharmacist for  more information. This medicine may affect blood sugar levels. If you have diabetes, check with your doctor or health care professional before you change your diet or the dose of your diabetic medicine. If you are going to have surgery tell your health care professional that you are taking this drug. What side effects may I notice from receiving this medicine? Side effects that you should report to your doctor or health care professional as soon as possible: -allergic reactions like skin rash, itching or hives, swelling of the face, lips, or tongue -dark urine -fever -joint pain -muscle cramps, pain -redness, blistering, peeling or loosening of the skin, including inside the mouth -trouble passing urine or change in the amount of urine -unusually weak or tired -yellowing of eyes or skin Side effects that usually do not require medical attention (report to your doctor or health care professional if they continue or are bothersome): -constipation -heartburn -stomach gas, pain, upset This list may not describe all possible side effects. Call your doctor for medical advice about side effects. You may report side effects to FDA at 1-800-FDA-1088. Where should I keep my medicine? Keep out of the reach of children. Store at room temperature between 20 to 25 degrees C (68 to 77 degrees F). Throw away any unused medicine after the expiration date. NOTE: This sheet is a summary. It may not cover all possible information. If you have questions about this medicine, talk to your doctor, pharmacist, or health care provider.    2016, Elsevier/Gold Standard. (2011-09-02 FT:[id number])

## 2016-01-31 NOTE — Assessment & Plan Note (Signed)
-  Continue Chantix

## 2016-01-31 NOTE — Assessment & Plan Note (Signed)
Continue the Anoro.   Continue smoking cessation.  Return in a few months.

## 2016-01-31 NOTE — Progress Notes (Signed)
       Cody Flores is a 50 y.o. male who presents to [place] today for cholesterol, smoking.   patient was seen last month where he was diagnosed with COPD. In the interim he has been using the Anoro inhaler and feels great. He notes increased exercise tolerance. No wheezing or shortness of breath.   He has not yet started the Lipitor as he does not think he was notified.  Additionally he continues to smoke but has reduced his smoking on Chantix. He tolerates the Chantix well.    Past Medical History  Diagnosis Date  . Diverticulitis    Past Surgical History  Procedure Laterality Date  . Laparoscopic sigmoid colectomy     Social History  Substance Use Topics  . Smoking status: Current Every Day Smoker  . Smokeless tobacco: Not on file  . Alcohol Use: 0.0 oz/week    0 Standard drinks or equivalent per week   family history includes Aneurysm in his father; Cancer in his father; Diabetes in his maternal grandmother.  ROS as above Medications: Current Outpatient Prescriptions  Medication Sig Dispense Refill  . lansoprazole (PREVACID) 30 MG capsule Take 30 mg by mouth daily at 12 noon.    . triamcinolone cream (KENALOG) 0.1 % Apply 1 application topically 2 (two) times daily. 453.6 g 1  . umeclidinium-vilanterol (ANORO ELLIPTA) 62.5-25 MCG/INH AEPB Inhale 1 puff into the lungs daily. 1 each 12  . atorvastatin (LIPITOR) 20 MG tablet Take 1 tablet (20 mg total) by mouth daily. (Patient not taking: Reported on 01/31/2016) 30 tablet 1  . varenicline (CHANTIX) 1 MG tablet Take 1 tablet (1 mg total) by mouth 2 (two) times daily. 60 tablet 3   No current facility-administered medications for this visit.   No Known Allergies   Exam:  BP 127/84 mmHg  Pulse 62  Wt 211 lb (95.709 kg)  SpO2 95% Gen: Well NAD HEENT: EOMI,  MMM Lungs: Normal work of breathing. CTABL Heart: RRR no  MRG Abd: NABS, Soft. Nondistended, Nontender Exts: Brisk capillary refill, warm and well perfused.   No results found for this or any previous visit (from the past 24 hour(s)). No results found.   Please see individual assessment and plan sections.

## 2016-01-31 NOTE — Assessment & Plan Note (Addendum)
Start lipitor.  Recheck in a few month

## 2016-01-31 NOTE — Assessment & Plan Note (Signed)
Diet and weight loss.  

## 2016-02-05 MED FILL — ATORVASTATIN 20 MG TABLET: 20 | 30 days supply | Qty: 30 | Fill #0

## 2016-02-05 MED FILL — ANORO ELLIPTA 62.5-25 MCG I: 62.5-25 | 30 days supply | Qty: 60 | Fill #1

## 2016-02-21 DIAGNOSIS — Z135 Encounter for screening for eye and ear disorders: Secondary | ICD-10-CM | POA: Diagnosis not present

## 2016-02-21 DIAGNOSIS — H524 Presbyopia: Secondary | ICD-10-CM | POA: Diagnosis not present

## 2016-02-21 DIAGNOSIS — Q142 Congenital malformation of optic disc: Secondary | ICD-10-CM | POA: Diagnosis not present

## 2016-02-21 DIAGNOSIS — H5203 Hypermetropia, bilateral: Secondary | ICD-10-CM | POA: Diagnosis not present

## 2016-03-04 ENCOUNTER — Encounter: Payer: Self-pay | Admitting: Family Medicine

## 2016-03-14 MED FILL — CHANTIX 1 MG TABLET: 1 | 30 days supply | Qty: 60 | Fill #1

## 2016-03-14 MED FILL — ANORO ELLIPTA 62.5-25 MCG I: 62.5-25 | 30 days supply | Qty: 60 | Fill #2

## 2016-03-14 MED FILL — ATORVASTATIN 20 MG TABLET: 20 | 30 days supply | Qty: 30 | Fill #1 | Status: TO

## 2016-05-02 MED FILL — ANORO ELLIPTA 62.5-25 MCG I: 62.5-25 | 30 days supply | Qty: 60 | Fill #3 | Status: TO

## 2016-05-08 ENCOUNTER — Ambulatory Visit: Payer: 59 | Admitting: Family Medicine

## 2016-05-15 ENCOUNTER — Encounter: Payer: Self-pay | Admitting: Family Medicine

## 2016-05-15 ENCOUNTER — Ambulatory Visit (INDEPENDENT_AMBULATORY_CARE_PROVIDER_SITE_OTHER): Payer: 59 | Admitting: Family Medicine

## 2016-05-15 ENCOUNTER — Telehealth: Payer: Self-pay | Admitting: Family Medicine

## 2016-05-15 VITALS — BP 142/83 | HR 57 | Wt 211.0 lb

## 2016-05-15 DIAGNOSIS — L858 Other specified epidermal thickening: Secondary | ICD-10-CM | POA: Diagnosis not present

## 2016-05-15 DIAGNOSIS — E785 Hyperlipidemia, unspecified: Secondary | ICD-10-CM

## 2016-05-15 DIAGNOSIS — E669 Obesity, unspecified: Secondary | ICD-10-CM

## 2016-05-15 DIAGNOSIS — E559 Vitamin D deficiency, unspecified: Secondary | ICD-10-CM

## 2016-05-15 DIAGNOSIS — R7303 Prediabetes: Secondary | ICD-10-CM | POA: Diagnosis not present

## 2016-05-15 DIAGNOSIS — J439 Emphysema, unspecified: Secondary | ICD-10-CM

## 2016-05-15 DIAGNOSIS — L570 Actinic keratosis: Secondary | ICD-10-CM | POA: Diagnosis not present

## 2016-05-15 LAB — COMPREHENSIVE METABOLIC PANEL
ALBUMIN: 4.4 g/dL (ref 3.6–5.1)
ALK PHOS: 70 U/L (ref 40–115)
ALT: 21 U/L (ref 9–46)
AST: 18 U/L (ref 10–35)
BILIRUBIN TOTAL: 0.4 mg/dL (ref 0.2–1.2)
BUN: 15 mg/dL (ref 7–25)
CALCIUM: 9.3 mg/dL (ref 8.6–10.3)
CO2: 27 mmol/L (ref 20–31)
Chloride: 103 mmol/L (ref 98–110)
Creat: 1.04 mg/dL (ref 0.70–1.33)
Glucose, Bld: 102 mg/dL — ABNORMAL HIGH (ref 65–99)
Potassium: 4.4 mmol/L (ref 3.5–5.3)
Sodium: 139 mmol/L (ref 135–146)
Total Protein: 7 g/dL (ref 6.1–8.1)

## 2016-05-15 LAB — LIPID PANEL
Cholesterol: 213 mg/dL — ABNORMAL HIGH (ref 125–200)
HDL: 37 mg/dL — AB (ref >=40)
LDL Cholesterol: 153 mg/dL — ABNORMAL HIGH (ref <130)
TRIGLYCERIDES: 114 mg/dL (ref <150)
Total CHOL/HDL Ratio: 5.8 Ratio — ABNORMAL HIGH (ref <=5.0)
VLDL: 23 mg/dL (ref <30)

## 2016-05-15 LAB — CBC
HEMATOCRIT: 45.1 % (ref 38.5–50.0)
HEMOGLOBIN: 15 g/dL (ref 13.2–17.1)
MCH: 29 pg (ref 27.0–33.0)
MCHC: 33.3 g/dL (ref 32.0–36.0)
MCV: 87.2 fL (ref 80.0–100.0)
MPV: 10.2 fL (ref 7.5–12.5)
Platelets: 237 10*3/uL (ref 140–400)
RBC: 5.17 MIL/uL (ref 4.20–5.80)
RDW: 15.1 % — ABNORMAL HIGH (ref 11.0–15.0)
WBC: 7 10*3/uL (ref 3.8–10.8)

## 2016-05-15 MED ORDER — ATORVASTATIN CALCIUM 20 MG PO TABS: 20.0000 mg | ORAL_TABLET | Freq: Every day | ORAL | Status: DC

## 2016-05-15 NOTE — Telephone Encounter (Signed)
Pt states he is using  [employer]

## 2016-05-15 NOTE — Progress Notes (Signed)
Cody Flores is a 50 y.o. male who presents to [place]: [facility] today for follow-up COPD, smoking, hyperlipidemia, and discuss skin lesion.  COPD: Patient was unable to quit smoking as he's had increased life stressors. He does not feel ready to try to another quit attempt at this time. As for his COPD he notes is well-controlled with the below medications. He feels as though he can breathe well.  Hyperlipidemia: Patient tolerates Lipitor well with no chest pains palpitations shortness of breath or muscle soreness.  Lesion: As noted previously patient has a papular lesion on the left posterior calf. This is thought to be a seborrheic dermatitis previously. He notes the skin lesion has changed become more crusty and protruding further from the skin in the interim few months.   Past Medical History  Diagnosis Date  . GERD (gastroesophageal reflux disease)   . Diverticulitis    Past Surgical History  Procedure Laterality Date  . Colectomy    . Vasectomy    . Laparoscopic sigmoid colectomy     Social History  Substance Use Topics  . Smoking status: Current Every Day Smoker  . Smokeless tobacco: Not on file  . Alcohol Use: 0.0 oz/week    0 Standard drinks or equivalent per week   family history includes Aneurysm in his father; Cancer in his father; Diabetes in his maternal grandmother.  ROS as above:  Medications: Current Outpatient Prescriptions  Medication Sig Dispense Refill  . atorvastatin (LIPITOR) 20 MG tablet Take 1 tablet (20 mg total) by mouth daily. 90 tablet 1  . lansoprazole (PREVACID) 30 MG capsule Take 30 mg by mouth daily at 12 noon.    . triamcinolone cream (KENALOG) 0.1 % Apply 1 application topically 2 (two) times daily. 453.6 g 1  . umeclidinium-vilanterol (ANORO ELLIPTA) 62.5-25 MCG/INH AEPB Inhale 1 puff into the lungs daily. 1 each 12    No current facility-administered medications for this visit.   No Known Allergies   Exam:  BP 142/83 mmHg  Pulse 57  Wt 211 lb (95.709 kg)  SpO2 95% Gen: Well NAD HEENT: EOMI,  MMM Lungs: Normal work of breathing. CTABL Heart: RRR no MRG Abd: NABS, Soft. Nondistended, Nontender Exts: Brisk capillary refill, warm and well perfused.  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  Excisional biopsy: Consent obtained and timeout performed. Skin cleaned with chlorhexidine cold spray applied and the wound was numbed with 5 mL of lidocaine achieving good anesthesia. The skin prepped and draped in the usual sterile fashion using chlorhexidine. A sharp incision was made achieving a good elliptical shape approximately 3 x 1.5 cm surrounding the skin lesion with at least 2 mm margins on all side. The skin was undermined initially shallow and deep to the lesion and the skin and lesion was removed. Bleeding was controlled with pressure and 3 Vicryl sutures. Skin edges undermined slightly and the wound edges were closed with 3 horizontal mattress sutures. Sterile dressing applied. Patient tolerated the procedure well.  No results found for this or any previous visit (from the past 24 hour(s)). No results found.    Assessment and Plan: 50 y.o. male with   COPD: Stable. Continue inhalers. Smoking cessation discussed below..  Hyperlipidemia, obesity, prediabetes: Continue current regimen. 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 Excisional biopsy today. Skin pathology pending. Return in 10-14 days for suture removal. Wound  care instructions provided.   Smoking: Patient failed Chantix. He is not ready to try another quit attempt to lie stressors. Will recheck in the near future. Discussed warning signs or symptoms. Please see discharge instructions. Patient expresses understanding.

## 2016-05-15 NOTE — Patient Instructions (Signed)
Thank you for coming in today. Return in 10-14 days for suture removal.  Keep the skin clean and dry.  Return sooner if needed.  Continue medicines.  Get labs today.  We will recheck blood pressure at the follow up.   Sutured Wound Care Sutures are stitches that can be used to close wounds. Taking care of your wound properly can help to prevent pain and infection. It can also help your wound to heal more quickly. HOW TO CARE FOR YOUR SUTURED WOUND Wound Care  Keep the wound clean and dry.  If you were given a bandage (dressing), you should change it at least once per day or as directed by your health care provider. You should also change it if it becomes wet or dirty.  Keep the wound completely dry for the first 24 hours or as directed by your health care provider. After that time, you may shower or bathe. However, make sure that the wound is not soaked in water until the sutures have been removed.  Clean the wound one time each day or as directed by your health care provider.  Wash the wound with soap and water.  Rinse the wound with water to remove all soap.  Pat the wound dry with a clean towel. Do not rub the wound.  Aftercleaning the wound, apply a thin layer of antibioticointment as directed by your health care provider. This will help to prevent infection and keep the dressing from sticking to the wound.  Have the sutures removed as directed by your health care provider. General Instructions  Take or apply medicines only as directed by your health care provider.  To help prevent scarring, make sure to cover your wound with sunscreen whenever you are outside after the sutures are removed and the wound is healed. Make sure to wear a sunscreen of at least 30 SPF.  If you were prescribed an antibiotic medicine or ointment, finish all of it even if you start to feel better.  Do not scratch or pick at the wound.  Keep all follow-up visits as directed by your health care  provider. This is important.  Check your wound every day for signs of infection. Watch for:   Redness, swelling, or pain.  Fluid, blood, or pus.  Raise (elevate) the injured area above the level of your heart while you are sitting or lying down, if possible.  Avoid stretching your wound.  Drink enough fluids to keep your urine clear or pale yellow. SEEK MEDICAL CARE IF:  You received a tetanus shot and you have swelling, severe pain, redness, or bleeding at the injection site.  You have a fever.  A wound that was closed breaks open.  You notice a bad smell coming from the wound.  You notice something coming out of the wound, such as wood or glass.  Your pain is not controlled with medicine.  You have increased redness, swelling, or pain at the site of your wound.  You have fluid, blood, or pus coming from your wound.  You notice a change in the color of your skin near your wound.  You need to change the dressing frequently due to fluid, blood, or pus draining from the wound.  You develop a new rash.  You develop numbness around the wound. SEEK IMMEDIATE MEDICAL CARE IF:  You develop severe swelling around the injury site.  Your pain suddenly increases and is severe.  You develop painful lumps near the wound or on skin that  is anywhere on your body.  You have a red streak going away from your wound.  The wound is on your hand or foot and you cannot properly move a finger or toe.  The wound is on your hand or foot and you notice that your fingers or toes look pale or bluish.   This information is not intended to replace advice given to you by your health care provider. Make sure you discuss any questions you have with your health care provider.   Document Released: 11/20/2004 Document Revised: 02/27/2015 Document Reviewed: 05/25/2013 Elsevier Interactive Patient Education 2016 [employer].   Squamous Cell Carcinoma Squamous cell carcinoma is a common form  of skin cancer. It begins in the squamous cells in the outer layer of the skin (epidermis). It occurs most often in parts of the body that are frequently exposed to the sun, such as the face, lips, neck, arms, legs, and hands. However, this condition can occur anywhere on the body, including the inside of the mouth, sites of long-term (chronic) scarring, and the anus. If squamous cell carcinoma is treated soon enough, it rarely spreads to other areas of the body (metastasizes). If it is not treated, it can destroy nearby tissues. In rare cases, it can spread to other areas. CAUSES This condition is usually caused by exposure to ultraviolet (UV) light. UV light may come from the sun or from tanning beds. Other causes include:  Exposure to arsenic.  Exposure to radiation.  Exposure to toxic tars and oils.  Certain genetic conditions, such as xeroderma pigmentosum. RISK FACTORS This condition is more likely to develop in:  People who are older than 51 years of age.  People who have fair skin (light complexion).  People who have blonde or red hair.  People who have blue, green, or gray eyes.  People who have childhood freckling.  People who have had sun exposure over long periods of time, especially during childhood.  People who have had repeated sunburns.  People who have a weakened immune system.  People who have been exposed to certain chemicals, such as tar, soot, and arsenic.  People who have chronic inflammatory conditions.  People who have chronic infections.  People who have an HPV (human papillomavirus) infection.  People who have conditions that cause chronic scarring. These can include burn scars, chronic ulcers, heat (thermal) injuries, and radiation.  People who have had psoralen and ultraviolet A (PUVA) treatments.  People who smoke.  People who use tanning beds. SYMPTOMS This condition often starts as small pink or brown growths on the skin. The growths have  a rough surface that may feel like sandpaper. In some cases, the growths are easier to feel than to see. The growths may develop into a sore that does not heal. DIAGNOSIS This condition may be diagnosed with:  A physical exam.  Removal of a tissue sample to be examined under a microscope (biopsy). TREATMENT Treatment for this condition involves removing the cancerous tissue. The method that is used for this depends on the size and location of the tumors, as well as your overall health. Possible treatments include:  Mohs surgery. In this procedure, the cancerous skin cells are removed layer by layer until all of the tumor has been removed.  Surgical removal (excision) of the tumor. This involves removing the entire tumor and a small amount of normal skin that surrounds it.  Cryosurgery. This involves freezing the tumor with liquid nitrogen.  Plastic surgery. The tumor is removed,  and healthy skin from another part of the body is used to cover the wound. This may be done for large tumors that are in areas where it is not possible to stretch the nearby skin to sew the edges of the wound together.  Radiation. This may be used for tumors on the face.  Photodynamic therapy. A chemical cream is applied to the skin, and light exposure is used to activate the chemical.  Electrodesiccation and curettage. This involves alternately scraping and burning the tumor while using an electric current to control bleeding. HOME CARE INSTRUCTIONS  Avoid unprotected sun exposure.  Do self-exams as told by your health care provider. Look for new spots or changes in your skin.  Keep all follow-up visits as told by your health care provider. This is important.  Do not use tobacco products, including cigarettes, chewing tobacco, or e-cigarettes. If you need help quitting, ask your health care provider. PREVENTION  Avoid the sun when it is the strongest. This is usually between 10:00 a.m. and 4:00 p.m.  When  you are out in the sun, use a sunscreen that has a sun protection factor (SPF) of at least 47.  Apply sunscreen at least 30 minutes before exposure to the sun.  Reapply sunscreen every 2-4 hours while you are outside. Also reapply it after swimming and after excessive sweating.  Always wear hats, protective clothing, and UV-blocking sunglasses when you are outdoors.  Do not use tanning beds. SEEK MEDICAL CARE IF:  You notice any new growths or any changes in your skin.  You have had a squamous cell carcinoma tumor removed and you notice a new growth in the same location.   This information is not intended to replace advice given to you by your health care provider. Make sure you discuss any questions you have with your health care provider.   Document Released: 04/19/2003 Document Revised: 07/04/2015 Document Reviewed: 02/05/2015 Elsevier Interactive Patient Education [employer].

## 2016-05-16 LAB — VITAMIN D 25 HYDROXY (VIT D DEFICIENCY, FRACTURES): VIT D 25 HYDROXY: 27 ng/mL — AB (ref 30–100)

## 2016-05-29 ENCOUNTER — Ambulatory Visit (INDEPENDENT_AMBULATORY_CARE_PROVIDER_SITE_OTHER): Payer: 59 | Admitting: Family Medicine

## 2016-05-29 ENCOUNTER — Encounter: Payer: Self-pay | Admitting: Family Medicine

## 2016-05-29 VITALS — BP 136/84 | HR 71 | Wt 219.0 lb

## 2016-05-29 DIAGNOSIS — E785 Hyperlipidemia, unspecified: Secondary | ICD-10-CM | POA: Diagnosis not present

## 2016-05-29 DIAGNOSIS — L858 Other specified epidermal thickening: Secondary | ICD-10-CM | POA: Diagnosis not present

## 2016-05-29 NOTE — Patient Instructions (Signed)
Thank you for coming in today. Return in 6 months.  Try to take lipitor regularly.  Work on cutting back smoking.

## 2016-05-29 NOTE — Progress Notes (Signed)
       Cody Flores is a 50 y.o. male who presents to [facility]: [facility] today for  Follow-up for suture removal and follow-up  For hyperlipidemia.   Patient had a skin lesion removed from his calf on July 20th. He is doing well with no significant pain or redness.  She skin lesion came back as an actinic keratosis with clean margins.   Hyperlipidemia: He notes that he has not been taking his Lipitor regularly. He feels well however.    Past Medical History:  Diagnosis Date  . Diverticulitis   . GERD (gastroesophageal reflux disease)    Past Surgical History:  Procedure Laterality Date  . COLECTOMY    . LAPAROSCOPIC SIGMOID COLECTOMY    . VASECTOMY     Social History  Substance Use Topics  . Smoking status: Current Every Day Smoker  . Smokeless tobacco: Not on file  . Alcohol use 0.0 oz/week   family history includes Aneurysm in his father; Cancer in his father; Diabetes in his maternal grandmother.  ROS as above:  Medications: Current Outpatient Prescriptions  Medication Sig Dispense Refill  . atorvastatin (LIPITOR) 20 MG tablet Take 1 tablet (20 mg total) by mouth daily. 90 tablet 1  . lansoprazole (PREVACID) 30 MG capsule Take 30 mg by mouth daily at 12 noon.    . triamcinolone cream (KENALOG) 0.1 % Apply 1 application topically 2 (two) times daily. 453.6 g 1  . umeclidinium-vilanterol (ANORO ELLIPTA) 62.5-25 MCG/INH AEPB Inhale 1 puff into the lungs daily. 1 each 12   No current facility-administered medications for this visit.    No Known Allergies   Exam:  BP 136/84   Pulse 71   Wt 219 lb (99.3 kg)   BMI 32.34 kg/m  Gen: Well NAD HEENT: EOMI,  MMM Lungs: Normal work of breathing. CTABL Heart: RRR no MRG Abd: NABS, Soft. Nondistended, Nontender Exts: Brisk capillary refill, warm and well perfused.  Skin: Well appearing sutured wound left posterior  calf with 3 horizontal mattress sutures  3 sutures removed and Steri-Strips applied  Lab Results  Component Value Date   CHOL 213 (H) 05/15/2016   HDL 37 (L) 05/15/2016   LDLCALC 153 (H) 05/15/2016   TRIG 114 05/15/2016   CHOLHDL 5.8 (H) 05/15/2016     No results found for this or any previous visit (from the past 24 hour(s)). No results found.    Assessment and Plan: 50 y.o. male with  1) well-appearing wound. Sutures removed. Return as needed  2) hyperlipidemia recommend taking Lipitor regularly   No orders of the defined types were placed in this encounter.   Discussed warning signs or symptoms. Please see discharge instructions. Patient expresses understanding.

## 2016-06-12 MED FILL — ANORO ELLIPTA 62.5-25 MCG I: 62.5-25 | 30 days supply | Qty: 60 | Fill #0

## 2016-06-12 MED FILL — ATORVASTATIN 20 MG TABLET: 20 | 30 days supply | Qty: 30 | Fill #0

## 2016-12-04 ENCOUNTER — Encounter: Payer: Self-pay | Admitting: Family Medicine

## 2016-12-04 ENCOUNTER — Ambulatory Visit (INDEPENDENT_AMBULATORY_CARE_PROVIDER_SITE_OTHER): Payer: 59 | Admitting: Family Medicine

## 2016-12-04 VITALS — BP 143/87 | HR 76 | Temp 97.9°F | Resp 18 | Wt 215.0 lb

## 2016-12-04 DIAGNOSIS — F17200 Nicotine dependence, unspecified, uncomplicated: Secondary | ICD-10-CM | POA: Diagnosis not present

## 2016-12-04 DIAGNOSIS — E782 Mixed hyperlipidemia: Secondary | ICD-10-CM | POA: Diagnosis not present

## 2016-12-04 DIAGNOSIS — Z1211 Encounter for screening for malignant neoplasm of colon: Secondary | ICD-10-CM

## 2016-12-04 DIAGNOSIS — R0602 Shortness of breath: Secondary | ICD-10-CM | POA: Diagnosis not present

## 2016-12-04 DIAGNOSIS — R7303 Prediabetes: Secondary | ICD-10-CM

## 2016-12-04 DIAGNOSIS — J439 Emphysema, unspecified: Secondary | ICD-10-CM | POA: Diagnosis not present

## 2016-12-04 LAB — POCT GLYCOSYLATED HEMOGLOBIN (HGB A1C): HEMOGLOBIN A1C: 5.9

## 2016-12-04 MED ORDER — BUPROPION HCL ER (XL) 150 MG PO TB24: 150.0000 mg | ORAL_TABLET | ORAL | 0 refills | Status: DC

## 2016-12-04 MED ORDER — PRAVASTATIN SODIUM 40 MG PO TABS: 40.0000 mg | ORAL_TABLET | Freq: Every day | ORAL | 0 refills | Status: DC

## 2016-12-04 MED FILL — PRAVASTATIN NA 40 MG TAB: 40 | 90 days supply | Qty: 90 | Fill #0

## 2016-12-04 MED FILL — BUPROPION HCL XL 150 MG TAB: 150 | 90 days supply | Qty: 90 | Fill #0

## 2016-12-04 NOTE — Patient Instructions (Signed)
Thank you for coming in today. Start wellbutrin soon.  Set a quit date a few weeks after starting wellbutrin.  Use gum or lozenges for nicotine replacement.  Recheck with me about 1 month after the quit date.  STOP lipitor START pravastatin.

## 2016-12-04 NOTE — Progress Notes (Signed)
Pt here for 6 month follow-up.   A1C- 5.9

## 2016-12-04 NOTE — Progress Notes (Signed)
RIELLY Cody Flores is a 51 y.o. male who presents to [facility] today for follow up prediabetes, HLD, COPD, GERD.  Prediabetes: A1C 6.1 three months ago. He has been eating out less often and switched his breakfast from a biscuit to oats. Still eats a lot of junk food at work. He wants to control this with diet and exercise and feels that he can still improve these a lot.  HLD: He stopped taking atorvastatin 20 mg due to nausea.   COPD: He stopped taking anoro ellipta due to chest pressure/congestion and his throat feeling irritated and sore. He gets short of breath after climbing several sets of stairs, but not when walking back and forth at work. Does not use albuterol.  GERD: He is tolerating prevecid well with no side effects.   Smoking cessation: He smokes less than 1 ppd. Tried chantix and a patch (20 mg) with no effect. He is motivated to quit, since his father (also a smoker) passed away 6 months ago.    Past Medical History:  Diagnosis Date  . Diverticulitis   . GERD (gastroesophageal reflux disease)    Past Surgical History:  Procedure Laterality Date  . COLECTOMY    . LAPAROSCOPIC SIGMOID COLECTOMY    . VASECTOMY     Social History  Substance Use Topics  . Smoking status: Current Every Day Smoker  . Smokeless tobacco: Not on file  . Alcohol use 0.0 oz/week   family history includes Aneurysm in his father; Cancer in his father; Diabetes in his maternal grandmother.  ROS as above:  Medications: Current Outpatient Prescriptions  Medication Sig Dispense Refill  . buPROPion (WELLBUTRIN XL) 150 MG 24 hr tablet Take 1 tablet (150 mg total) by mouth every morning. 90 tablet 0  . lansoprazole (PREVACID) 30 MG capsule Take 30 mg by mouth daily at 12 noon.    . pravastatin (PRAVACHOL) 40 MG tablet Take 1 tablet (40 mg total) by mouth daily. 90 tablet 0    . triamcinolone cream (KENALOG) 0.1 % Apply 1 application topically 2 (two) times daily. 453.6 g 1   No current facility-administered medications for this visit.    No Known Allergies  Health Maintenance Health Maintenance  Topic Date Due  . COLONOSCOPY  01/12/2015  . INFLUENZA VACCINE  06/27/2017 (Originally 05/27/2016)  . HIV Screening  06/27/2017 (Originally [birth year]/12/1980)  . TETANUS/TDAP  01/02/2026     Exam:  BP (!) 143/87 (BP Location: Right Arm, Cuff Size: Large)   Pulse 76   Temp 97.9 F (36.6 C) (Oral)   Resp 18   Wt 215 lb (97.5 kg)   SpO2 95%   BMI 31.75 kg/m  Gen: Well NAD HEENT: EOMI,  MMM Lungs: Normal work of breathing. CTABL Heart: RRR no MRG Abd: NABS, Soft. Nondistended, Nontender Exts: Brisk capillary refill, warm and well perfused.    Results for orders placed or performed in visit on 12/04/16 (from the past 72 hour(s))  POCT HgB A1C     Status: None   Collection Time: 12/04/16 11:50 AM  Result Value Ref Range   Hemoglobin A1C 5.9    No results found.    Assessment and Plan: 51 y.o. male with:  Prediabetes: A1C 5.9 today, mildly improved from 3 months ago.  HTN: BP elevated today and has been mildly elevated at past visits. Patient wants to maximize diet and exercise interventions before considering medication. BP log at home.  HLD: Switch lipitor to pravastatin.  COPD: Stop anoro ellipta; he is not SOB on mild exertion.  GERD: Continue prevecid.  Smoking cessation: Start wellbutrin; lozenges or gum as needed for craving. He will set a quit date in a few weeks after a move and follow up 1 month later.  Health maintenance: Refer for colonoscopy today, as he was due last year. Patient declines HIV screening and flu shot.  Orders Placed This Encounter  Procedures  . Ambulatory referral to Gastroenterology    Referral Priority:   Routine    Referral Type:   Consultation    Referral Reason:   Specialty Services Required    Requested  Specialty:   Gastroenterology    Number of Visits Requested:   1  . POCT HgB A1C   Meds ordered this encounter  Medications  . buPROPion (WELLBUTRIN XL) 150 MG 24 hr tablet    Sig: Take 1 tablet (150 mg total) by mouth every morning.    Dispense:  90 tablet    Refill:  0  . pravastatin (PRAVACHOL) 40 MG tablet    Sig: Take 1 tablet (40 mg total) by mouth daily.    Dispense:  90 tablet    Refill:  0    We spent 5 minutes discussing tobacco cessation.   Discussed warning signs or symptoms. Please see discharge instructions. Patient expresses understanding.

## 2017-03-04 ENCOUNTER — Encounter: Payer: Self-pay | Admitting: Family Medicine

## 2017-03-05 ENCOUNTER — Ambulatory Visit (INDEPENDENT_AMBULATORY_CARE_PROVIDER_SITE_OTHER): Payer: 59 | Admitting: Family Medicine

## 2017-03-05 ENCOUNTER — Encounter: Payer: Self-pay | Admitting: Family Medicine

## 2017-03-05 VITALS — BP 150/86 | HR 63 | Ht 69.0 in | Wt 214.0 lb

## 2017-03-05 DIAGNOSIS — J439 Emphysema, unspecified: Secondary | ICD-10-CM | POA: Diagnosis not present

## 2017-03-05 DIAGNOSIS — I10 Essential (primary) hypertension: Secondary | ICD-10-CM

## 2017-03-05 DIAGNOSIS — K219 Gastro-esophageal reflux disease without esophagitis: Secondary | ICD-10-CM

## 2017-03-05 DIAGNOSIS — R011 Cardiac murmur, unspecified: Secondary | ICD-10-CM | POA: Diagnosis not present

## 2017-03-05 DIAGNOSIS — E782 Mixed hyperlipidemia: Secondary | ICD-10-CM | POA: Diagnosis not present

## 2017-03-05 HISTORY — DX: Essential (primary) hypertension: I10

## 2017-03-05 MED ORDER — LISINOPRIL 10 MG PO TABS: 10.0000 mg | ORAL_TABLET | Freq: Every day | ORAL | 1 refills | Status: DC

## 2017-03-05 MED ORDER — ALBUTEROL SULFATE HFA 108 (90 BASE) MCG/ACT IN AERS: 2.0000 | INHALATION_SPRAY | Freq: Four times a day (QID) | RESPIRATORY_TRACT | 1 refills | Status: DC | PRN

## 2017-03-05 MED FILL — LISINOPRIL 10 MG TABLET: 10 | 30 days supply | Qty: 30 | Fill #0

## 2017-03-05 NOTE — Patient Instructions (Addendum)
Thank you for coming in today. Start lisinopril daily.  Recheck in 1 month.  We will check pressure and kidney function.   Work on cutting back to [employer] a day.   Return sooner if needed.   Lisinopril tablets What is this medicine? LISINOPRIL (lyse IN oh pril) is an ACE inhibitor. This medicine is used to treat high blood pressure and heart failure. It is also used to protect the heart immediately after a heart attack. This medicine may be used for other purposes; ask your health care provider or pharmacist if you have questions. COMMON BRAND NAME(S): Prinivil, Zestril What should I tell my health care provider before I take this medicine? They need to know if you have any of these conditions: -diabetes -heart or blood vessel disease -kidney disease -low blood pressure -previous swelling of the tongue, face, or lips with difficulty breathing, difficulty swallowing, hoarseness, or tightening of the throat -an unusual or allergic reaction to lisinopril, other ACE inhibitors, insect venom, foods, dyes, or preservatives -pregnant or trying to get pregnant -breast-feeding How should I use this medicine? Take this medicine by mouth with a glass of water. Follow the directions on your prescription label. You may take this medicine with or without food. If it upsets your stomach, take it with food. Take your medicine at regular intervals. Do not take it more often than directed. Do not stop taking except on your doctor's advice. Talk to your pediatrician regarding the use of this medicine in children. Special care may be needed. While this drug may be prescribed for children as young as 77 years of age for selected conditions, precautions do apply. Overdosage: If you think you have taken too much of this medicine contact a poison control center or emergency room at once. NOTE: This medicine is only for you. Do not share this medicine with others. What if I miss a dose? If you miss a dose, take  it as soon as you can. If it is almost time for your next dose, take only that dose. Do not take double or extra doses. What may interact with this medicine? Do not take this medicine with any of the following medications: -hymenoptera venom -sacubitril; valsartan This medicines may also interact with the following medications: -aliskiren -angiotensin receptor blockers, like losartan or valsartan -certain medicines for diabetes -diuretics -everolimus -gold compounds -lithium -NSAIDs, medicines for pain and inflammation, like ibuprofen or naproxen -potassium salts or supplements -salt substitutes -sirolimus -temsirolimus This list may not describe all possible interactions. Give your health care provider a list of all the medicines, herbs, non-prescription drugs, or dietary supplements you use. Also tell them if you smoke, drink alcohol, or use illegal drugs. Some items may interact with your medicine. What should I watch for while using this medicine? Visit your doctor or health care professional for regular check ups. Check your blood pressure as directed. Ask your doctor what your blood pressure should be, and when you should contact him or her. Do not treat yourself for coughs, colds, or pain while you are using this medicine without asking your doctor or health care professional for advice. Some ingredients may increase your blood pressure. Women should inform their doctor if they wish to become pregnant or think they might be pregnant. There is a potential for serious side effects to an unborn child. Talk to your health care professional or pharmacist for more information. Check with your doctor or health care professional if you get an attack of severe  diarrhea, nausea and vomiting, or if you sweat a lot. The loss of too much body fluid can make it dangerous for you to take this medicine. You may get drowsy or dizzy. Do not drive, use machinery, or do anything that needs mental  alertness until you know how this drug affects you. Do not stand or sit up quickly, especially if you are an older patient. This reduces the risk of dizzy or fainting spells. Alcohol can make you more drowsy and dizzy. Avoid alcoholic drinks. Avoid salt substitutes unless you are told otherwise by your doctor or health care professional. What side effects may I notice from receiving this medicine? Side effects that you should report to your doctor or health care professional as soon as possible: -allergic reactions like skin rash, itching or hives, swelling of the hands, feet, face, lips, throat, or tongue -breathing problems -signs and symptoms of kidney injury like trouble passing urine or change in the amount of urine -signs and symptoms of increased potassium like muscle weakness; chest pain; or fast, irregular heartbeat -signs and symptoms of liver injury like dark yellow or brown urine; general ill feeling or flu-like symptoms; light-colored stools; loss of appetite; nausea; right upper belly pain; unusually weak or tired; yellowing of the eyes or skin -signs and symptoms of low blood pressure like dizziness; feeling faint or lightheaded, falls; unusually weak or tired -stomach pain with or without nausea and vomiting Side effects that usually do not require medical attention (report to your doctor or health care professional if they continue or are bothersome): -changes in taste -cough -dizziness -fever -headache -sensitivity to light This list may not describe all possible side effects. Call your doctor for medical advice about side effects. You may report side effects to FDA at 1-800-FDA-1088. Where should I keep my medicine? Keep out of the reach of children. Store at room temperature between 15 and 30 degrees C (59 and 86 degrees F). Protect from moisture. Keep container tightly closed. Throw away any unused medicine after the expiration date. NOTE: This sheet is a summary. It may  not cover all possible information. If you have questions about this medicine, talk to your doctor, pharmacist, or health care provider.  2018 Elsevier/Gold Standard (2015-12-03 12:52:35)   Heart Murmur A heart murmur is an extra sound that is caused by chaotic blood flow. The murmur can be heard as a "hum" or "whoosh" sound when blood flows through the heart. The heart has four areas called chambers. Valves separate the upper and lower chambers from each other (tricuspid valve and mitral valve) and separate the lower chambers of the heart from pathways that lead away from the heart (aortic valve and pulmonary valve). Normally, the valves open to let blood flow through or out of your heart, and then they shut to keep the blood from flowing backward. There are two types of heart murmurs:  Innocent murmurs. Most people with this type of heart murmur do not have a heart problem. Many children have innocent heart murmurs. Your health care provider may suggest some basic testing to find out whether your murmur is an innocent murmur. If an innocent heart murmur is found, there is no need for further tests or treatment and no need to restrict activities or stop playing sports.  Abnormal murmurs. These types of murmurs can occur in children and adults. Abnormal murmurs may be a sign of a more serious heart condition, such as a heart defect present at birth (congenital defect) or  heart valve disease. What are the causes? This condition is caused by heart valves that are not working properly. In children, abnormal heart murmurs are typically caused by congenital defects. In adults, abnormal murmurs are usually from heart valve problems caused by disease, infection, or aging. Three types of heart valve defects can cause a murmur:  Regurgitation. This is when blood leaks back through the valve in the wrong direction.  Mitral valve prolapse. This is when the mitral valve of the heart has a loose flap and does  not close tightly.  Stenosis. This is when a valve does not open enough and blocks blood flow. This condition may also be caused by:  Pregnancy.  Fever.  Overactive thyroid gland.  Anemia.  Exercise.  Rapid growth spurts (in children). What are the signs or symptoms? Innocent murmurs do not cause symptoms, and many people with abnormal murmurs may or may not have symptoms. If symptoms do develop, they may include:  Shortness of breath.  Blue coloring of the skin, especially on the fingertips.  Chest pain.  Palpitations, or feeling a fluttering or skipped heartbeat.  Fainting.  Persistent cough.  Getting tired much faster than expected.  Swelling in the abdomen, feet, or ankles. How is this diagnosed? This condition may be diagnosed during a routine physical or other exam. If your health care provider hears a murmur with a stethoscope, he or she will listen for:  Where the murmur is located in your heart.  How long the murmur lasts (duration).  When the murmur is heard during the heartbeat.  How loud the murmur is. This may help the health care provider figure out what is causing the murmur. You may be referred to a heart specialist (cardiologist). You may also have other tests, including:  Electrocardiogram (ECG or EKG). This test measures the electrical activity of your heart.  Echocardiogram. This test uses high frequency sound waves to make pictures of your heart.  MRI or chest X-ray.  Cardiac catheterization. This test looks at blood flow through the heart. For children and adults who have an abnormal heart murmur and want to stay active, it is important to complete testing, review test results, and receive recommendations from your health care provider. If heart disease is present, it may not be safe to play or be active. How is this treated? Heart murmurs themselves do not need treatment. In some cases, a heart murmur may go away on its own. If an  underlying problem or disease is causing the murmur, you may need treatment. If treatment is needed, it will depend on the type and severity of the disease or heart problem causing the murmur. Treatment may include:  Medicine.  Surgery.  Dietary and lifestyle changes. Follow these instructions at home:  Talk with your health care provider before participating in sports or other activities that require a lot of effort and energy (are strenuous).  Learn as much as possible about your condition and any related diseases. Ask your health care provider if you may at risk for any medical emergencies.  Talk with your health care provider about what symptoms you should look out for.  It is up to you to get your test results. Ask your health care provider, or the department that is doing the test, when your results will be ready.  Keep all follow-up visits as told by your health care provider. This is important. Contact a health care provider if:  You feel light-headed.  You are frequently short  of breath.  You feel more tired than usual.  You are having a hard time keeping up with normal activities or fitness routines.  You have swelling in your ankles or feet.  You have chest pain.  You notice that your heart often beats irregularly.  You develop any new symptoms. Get help right away if:  You develop severe chest pain.  You are having trouble breathing.  You have fainting spells.  Your symptoms suddenly get worse. These symptoms may represent a serious problem that is an emergency. Do not wait to see if the symptoms will go away. Get medical help right away. Call your local emergency services (911 in the [place] Do not drive yourself to the hospital. Summary  Normally, the heart valves open to let blood flow through or out of your heart, and then they shut to keep the blood from flowing backward.  Heart murmur is caused by heart valves that are not working properly.  You may  need treatment if an underlying problem or disease is causing the heart murmur. Treatment may include medicine, surgery, or dietary and lifestyle changes.  Talk with your health care provider before participating in sports or other activities that require a lot of effort and energy (are strenuous).  Talk with your health care provider about what symptoms you should watch out for. This information is not intended to replace advice given to you by your health care provider. Make sure you discuss any questions you have with your health care provider. Document Released: 11/20/2004 Document Revised: 10/01/2016 Document Reviewed: 10/01/2016 Elsevier Interactive Patient Education  2017 [employer].

## 2017-03-05 NOTE — Progress Notes (Signed)
Cody Flores is a 51 y.o. male who presents to [facility] today for follow up HTN, Smoking and HLD.   HTN: Patient is at elevated blood pressure previously. He has never taken blood pressure medications. He denies chest pain palpitations or shortness of breath.  Hyperlipidemia: Patient is currently taking pravastatin. He tolerates it well with no muscle aches or pains or nausea. He was intolerant to atorvastatin.  Smoking: Patient does not stop smoking. He has increased stress at work and does not stop smoking.  COPD: Patient denies significant wheezing. He denies shortness of breath. He will occasionally uses his wife's albuterol inhaler and would like one of his own. He thinks he uses it less than one time per week.   Past Medical History:  Diagnosis Date  . Diverticulitis   . GERD (gastroesophageal reflux disease)   . HTN (hypertension) 03/05/2017   Past Surgical History:  Procedure Laterality Date  . COLECTOMY    . LAPAROSCOPIC SIGMOID COLECTOMY    . VASECTOMY     Social History  Substance Use Topics  . Smoking status: Current Every Day Smoker  . Smokeless tobacco: Never Used  . Alcohol use 0.0 oz/week   family history includes Aneurysm in his father; Cancer in his father; Diabetes in his maternal grandmother.  ROS as above:  Medications: Current Outpatient Prescriptions  Medication Sig Dispense Refill  . albuterol (PROVENTIL HFA;VENTOLIN HFA) 108 (90 Base) MCG/ACT inhaler Inhale 2 puffs into the lungs every 6 (six) hours as needed for wheezing or shortness of breath. 1 Inhaler 1  . buPROPion (WELLBUTRIN XL) 150 MG 24 hr tablet Take 1 tablet (150 mg total) by mouth every morning. 90 tablet 0  . lansoprazole (PREVACID) 30 MG capsule Take 30 mg by mouth daily at 12 noon.    Marland Kitchen lisinopril (PRINIVIL,ZESTRIL) 10 MG tablet Take 1 tablet (10 mg total) by  mouth daily. 30 tablet 1  . pravastatin (PRAVACHOL) 40 MG tablet Take 1 tablet (40 mg total) by mouth daily. 90 tablet 0  . triamcinolone cream (KENALOG) 0.1 % Apply 1 application topically 2 (two) times daily. 453.6 g 1   No current facility-administered medications for this visit.    No Known Allergies  Health Maintenance Health Maintenance  Topic Date Due  . COLONOSCOPY  01/12/2015  . INFLUENZA VACCINE  06/27/2017 (Originally 05/27/2017)  . HIV Screening  06/27/2017 (Originally [birth year]/1980)  . TETANUS/TDAP  01/02/2026     Exam:  BP (!) 150/86   Pulse 63   Ht 5\' 9"  (1.753 m)   Wt 214 lb (97.1 kg)   BMI 31.60 kg/m  Gen: Well NAD HEENT: EOMI,  MMM Lungs: Normal work of breathing. CTABL Heart: RRR no Soft nonradiating murmur present Abd: NABS, Soft. Nondistended, Nontender Exts: Brisk capillary refill, warm and well perfused.    No results found for this or any previous visit (from the past 72 hour(s)). No results found.    Assessment and Plan: 51 y.o. male with  Hypertension: Not at goal start lisinopril.  Hyperlipidemia doing well continue pravastatin.  COPD: Use albuterol intermittently.  Smoking: Work on cutting back to 10 cigarettes per day.  Murmur: Unclear etiology. Plan for echocardiogram.  Recheck in one month.   Orders Placed This Encounter  Procedures  . ECHOCARDIOGRAM COMPLETE    Standing Status:   Future    Standing Expiration Date:   06/05/2018    Order Specific Question:  Where should this test be performed    Answer:   [facility]    Order Specific Question:   Perflutren DEFINITY (image enhancing agent) should be administered unless hypersensitivity or allergy exist    Answer:   Administer Perflutren    Order Specific Question:   Expected Date:    Answer:   1 month   Meds ordered this encounter  Medications  . DISCONTD: lisinopril (PRINIVIL,ZESTRIL) 10 MG tablet    Sig: Take 1 tablet (10 mg total) by mouth daily.    Dispense:   30 tablet    Refill:  1  . DISCONTD: albuterol (PROVENTIL HFA;VENTOLIN HFA) 108 (90 Base) MCG/ACT inhaler    Sig: Inhale 2 puffs into the lungs every 6 (six) hours as needed for wheezing or shortness of breath.    Dispense:  1 Inhaler    Refill:  1  . albuterol (PROVENTIL HFA;VENTOLIN HFA) 108 (90 Base) MCG/ACT inhaler    Sig: Inhale 2 puffs into the lungs every 6 (six) hours as needed for wheezing or shortness of breath.    Dispense:  1 Inhaler    Refill:  1  . lisinopril (PRINIVIL,ZESTRIL) 10 MG tablet    Sig: Take 1 tablet (10 mg total) by mouth daily.    Dispense:  30 tablet    Refill:  1     Discussed warning signs or symptoms. Please see discharge instructions. Patient expresses understanding.

## 2017-03-11 ENCOUNTER — Ambulatory Visit (HOSPITAL_BASED_OUTPATIENT_CLINIC_OR_DEPARTMENT_OTHER): Admission: RE | Admit: 2017-03-11 | Payer: 59 | Source: Ambulatory Visit

## 2017-03-26 ENCOUNTER — Ambulatory Visit (HOSPITAL_COMMUNITY)
Admission: RE | Admit: 2017-03-26 | Discharge: 2017-03-26 | Disposition: A | Discharge status: Home / Self Care | Payer: 59 | Source: Ambulatory Visit | Attending: Family Medicine | Admitting: Family Medicine

## 2017-03-26 DIAGNOSIS — R011 Cardiac murmur, unspecified: Secondary | ICD-10-CM | POA: Diagnosis not present

## 2017-03-26 DIAGNOSIS — Z720 Tobacco use: Secondary | ICD-10-CM | POA: Insufficient documentation

## 2017-03-26 DIAGNOSIS — I10 Essential (primary) hypertension: Secondary | ICD-10-CM | POA: Diagnosis not present

## 2017-03-26 DIAGNOSIS — I071 Rheumatic tricuspid insufficiency: Secondary | ICD-10-CM | POA: Insufficient documentation

## 2017-03-26 NOTE — Progress Notes (Signed)
  Echocardiogram 2D Echocardiogram has been performed.  Cody Flores 03/26/2017, 3:57 PM

## 2017-04-09 ENCOUNTER — Encounter: Payer: Self-pay | Admitting: Family Medicine

## 2017-04-09 ENCOUNTER — Ambulatory Visit (INDEPENDENT_AMBULATORY_CARE_PROVIDER_SITE_OTHER): Payer: 59 | Admitting: Family Medicine

## 2017-04-09 VITALS — BP 138/79 | HR 66 | Ht 69.0 in | Wt 213.0 lb

## 2017-04-09 DIAGNOSIS — R7303 Prediabetes: Secondary | ICD-10-CM | POA: Diagnosis not present

## 2017-04-09 DIAGNOSIS — I10 Essential (primary) hypertension: Secondary | ICD-10-CM

## 2017-04-09 DIAGNOSIS — E559 Vitamin D deficiency, unspecified: Secondary | ICD-10-CM | POA: Diagnosis not present

## 2017-04-09 MED ORDER — ALBUTEROL SULFATE HFA 108 (90 BASE) MCG/ACT IN AERS: 2.0000 | INHALATION_SPRAY | Freq: Four times a day (QID) | RESPIRATORY_TRACT | 1 refills | Status: DC | PRN

## 2017-04-09 MED ORDER — LISINOPRIL 20 MG PO TABS: 20.0000 mg | ORAL_TABLET | Freq: Every day | ORAL | 1 refills | Status: DC

## 2017-04-09 MED FILL — VENTOLIN HFA 90 MCG INHALER: 108 (90 BAS | 30 days supply | Qty: 18 | Fill #0

## 2017-04-09 MED FILL — LISINOPRIL 20 MG TAB: 20 | 30 days supply | Qty: 30 | Fill #0

## 2017-04-09 NOTE — Patient Instructions (Signed)
Thank you for coming in today. Increase lisinopril to 20 mg daily Get fasting labs in 2-4 weeks.  Return for a nurse visit to recheck blood pressure in about 1 month.  If all is well we will write for 90 day at a time of lisinopril with enough refils for 1 year.   You should hear about colonoscopy info soon.

## 2017-04-09 NOTE — Progress Notes (Signed)
Cody Flores is a 51 y.o. male who presents to [facility] today for follow-up HTN, rhinitis.  HTN: Started on 10 mg lisinopril last month, blood pressure much improved from 150/86 to 138/79 but still not within goal. Patient denies any issues with the medication and denies dizziness or chest pain.  Rhinitis: 2 week history of improving rhinitis congestion mild cough, similar to seasonal allergies patient has experienced in the past. Hasn't tried anything. No fever, chills, difficulty breathing. No sick contacts.    Past Medical History:  Diagnosis Date  . Diverticulitis   . GERD (gastroesophageal reflux disease)   . HTN (hypertension) 03/05/2017   Past Surgical History:  Procedure Laterality Date  . COLECTOMY    . LAPAROSCOPIC SIGMOID COLECTOMY    . VASECTOMY     Social History  Substance Use Topics  . Smoking status: Current Every Day Smoker  . Smokeless tobacco: Never Used  . Alcohol use 0.0 oz/week   family history includes Aneurysm in his father; Cancer in his father; Diabetes in his maternal grandmother.  ROS as above:  Medications: Current Outpatient Prescriptions  Medication Sig Dispense Refill  . albuterol (PROVENTIL HFA;VENTOLIN HFA) 108 (90 Base) MCG/ACT inhaler Inhale 2 puffs into the lungs every 6 (six) hours as needed for wheezing or shortness of breath. 1 Inhaler 1  . buPROPion (WELLBUTRIN XL) 150 MG 24 hr tablet Take 1 tablet (150 mg total) by mouth every morning. 90 tablet 0  . lansoprazole (PREVACID) 30 MG capsule Take 30 mg by mouth daily at 12 noon.    Marland Kitchen lisinopril (PRINIVIL,ZESTRIL) 20 MG tablet Take 1 tablet (20 mg total) by mouth daily. 30 tablet 1  . pravastatin (PRAVACHOL) 40 MG tablet Take 1 tablet (40 mg total) by mouth daily. 90 tablet 0  . triamcinolone cream (KENALOG) 0.1 % Apply 1 application topically 2 (two) times  daily. 453.6 g 1   No current facility-administered medications for this visit.    No Known Allergies  Health Maintenance Health Maintenance  Topic Date Due  . COLONOSCOPY  01/12/2015  . INFLUENZA VACCINE  06/27/2017 (Originally 05/27/2017)  . HIV Screening  06/27/2017 (Originally 01/[birth year]/1980)  . TETANUS/TDAP  01/02/2026     Exam:  BP 138/79   Pulse 66   Ht 5\' 9"  (1.753 m)   Wt 213 lb (96.6 kg)   BMI 31.45 kg/m  Gen: Well NAD HEENT: EOMI,  MMM Lungs: Normal work of breathing. CTABL Heart: RRR no MRG Abd: NABS, Soft. Nondistended, Nontender Exts: Brisk capillary refill, warm and well perfused.    No results found for this or any previous visit (from the past 72 hour(s)). No results found.    Assessment and Plan: 51 y.o. male with hypertension presents for follow up after starting lisinopril.  His blood pressure is improved from last visit but still not at target and patient is doing well on lisinopril. Increase dose to 20 mg and check labs next month with before next follow up visit. He has complained of a cough, but given that it is improving and associated with rhinitis and congestion, I think this is more likely to be seasonal allergies than related to the ACEi.  We will check lipids, vit D, and A1c with follow up at next visit.    Orders Placed This Encounter  Procedures  . COMPLETE METABOLIC PANEL WITH GFR  . CBC  . Lipid Panel w/reflex Direct LDL  .  VITAMIN D 25 Hydroxy (Vit-D Deficiency, Fractures)  . Hemoglobin A1c   Meds ordered this encounter  Medications  . DISCONTD: albuterol (PROVENTIL HFA;VENTOLIN HFA) 108 (90 Base) MCG/ACT inhaler    Sig: Inhale 2 puffs into the lungs every 6 (six) hours as needed for wheezing or shortness of breath.    Dispense:  1 Inhaler    Refill:  1  . lisinopril (PRINIVIL,ZESTRIL) 20 MG tablet    Sig: Take 1 tablet (20 mg total) by mouth daily.    Dispense:  30 tablet    Refill:  1  . albuterol (PROVENTIL HFA;VENTOLIN HFA)  108 (90 Base) MCG/ACT inhaler    Sig: Inhale 2 puffs into the lungs every 6 (six) hours as needed for wheezing or shortness of breath.    Dispense:  1 Inhaler    Refill:  1     Discussed warning signs or symptoms. Please see discharge instructions. Patient expresses understanding.

## 2017-04-17 ENCOUNTER — Encounter: Payer: Self-pay | Admitting: Family Medicine

## 2017-04-30 DIAGNOSIS — I10 Essential (primary) hypertension: Secondary | ICD-10-CM | POA: Diagnosis not present

## 2017-04-30 DIAGNOSIS — E559 Vitamin D deficiency, unspecified: Secondary | ICD-10-CM | POA: Diagnosis not present

## 2017-04-30 DIAGNOSIS — R7303 Prediabetes: Secondary | ICD-10-CM | POA: Diagnosis not present

## 2017-04-30 LAB — CBC
HCT: 43 % (ref 38.5–50.0)
Hemoglobin: 14.2 g/dL (ref 13.2–17.1)
MCH: 28.3 pg (ref 27.0–33.0)
MCHC: 33 g/dL (ref 32.0–36.0)
MCV: 85.7 fL (ref 80.0–100.0)
MPV: 10.2 fL (ref 7.5–12.5)
PLATELETS: 236 10*3/uL (ref 140–400)
RBC: 5.02 MIL/uL (ref 4.20–5.80)
RDW: 15 % (ref 11.0–15.0)
WBC: 7.6 10*3/uL (ref 3.8–10.8)

## 2017-05-01 LAB — COMPLETE METABOLIC PANEL WITH GFR
ALT: 15 U/L (ref 9–46)
AST: 16 U/L (ref 10–35)
Albumin: 4.1 g/dL (ref 3.6–5.1)
Alkaline Phosphatase: 75 U/L (ref 40–115)
BILIRUBIN TOTAL: 0.3 mg/dL (ref 0.2–1.2)
BUN: 21 mg/dL (ref 7–25)
CALCIUM: 9 mg/dL (ref 8.6–10.3)
CO2: 22 mmol/L (ref 20–31)
CREATININE: 1.1 mg/dL (ref 0.70–1.33)
Chloride: 103 mmol/L (ref 98–110)
GFR, Est African American: 89 mL/min (ref >=60)
GFR, Est Non African American: 77 mL/min (ref >=60)
Glucose, Bld: 125 mg/dL — ABNORMAL HIGH (ref 65–99)
Potassium: 4.4 mmol/L (ref 3.5–5.3)
Sodium: 137 mmol/L (ref 135–146)
TOTAL PROTEIN: 6.5 g/dL (ref 6.1–8.1)

## 2017-05-01 LAB — LIPID PANEL W/REFLEX DIRECT LDL
Cholesterol: 160 mg/dL (ref <200)
HDL: 39 mg/dL — AB (ref >40)
LDL-Cholesterol: 105 mg/dL — ABNORMAL HIGH
Non-HDL Cholesterol (Calc): 121 mg/dL (ref <130)
TRIGLYCERIDES: 70 mg/dL (ref <150)
Total CHOL/HDL Ratio: 4.1 Ratio (ref <5.0)

## 2017-05-01 LAB — HEMOGLOBIN A1C
Hgb A1c MFr Bld: 6.2 % — ABNORMAL HIGH (ref <5.7)
MEAN PLASMA GLUCOSE: 131 mg/dL

## 2017-05-01 LAB — VITAMIN D 25 HYDROXY (VIT D DEFICIENCY, FRACTURES): VIT D 25 HYDROXY: 22 ng/mL — AB (ref 30–100)

## 2017-05-14 ENCOUNTER — Ambulatory Visit (INDEPENDENT_AMBULATORY_CARE_PROVIDER_SITE_OTHER): Payer: 59 | Admitting: Family Medicine

## 2017-05-14 VITALS — BP 141/86 | HR 67

## 2017-05-14 DIAGNOSIS — I10 Essential (primary) hypertension: Secondary | ICD-10-CM | POA: Diagnosis not present

## 2017-05-14 MED ORDER — PRAVASTATIN SODIUM 40 MG PO TABS: 40.0000 mg | ORAL_TABLET | Freq: Every day | ORAL | 1 refills | Status: DC

## 2017-05-14 MED ORDER — LISINOPRIL 20 MG PO TABS: 20.0000 mg | ORAL_TABLET | Freq: Every day | ORAL | 1 refills | Status: DC

## 2017-05-14 MED FILL — LISINOPRIL 20 MG TAB: 20 | 90 days supply | Qty: 90 | Fill #0

## 2017-05-14 MED FILL — PRAVASTATIN NA 40 MG TAB: 40 | 90 days supply | Qty: 90 | Fill #0

## 2017-05-14 NOTE — Progress Notes (Signed)
BP OK as are labs.  Refilled Lisinopril

## 2017-05-14 NOTE — Progress Notes (Signed)
Pt present to the clinic for a 1 month BP check.  Denies headache, blurred vision, and dizziness. Pt reports taking his lisinopril every night. Bp 141/86. Notified pt of lab results done on 75/18. Pt advised he will receive a call with further recommendations. -EH/RMA

## 2017-08-21 MED FILL — LISINOPRIL 20 MG TAB: 20 | 90 days supply | Qty: 90 | Fill #1

## 2017-10-15 ENCOUNTER — Emergency Department (INDEPENDENT_AMBULATORY_CARE_PROVIDER_SITE_OTHER): Payer: 59

## 2017-10-15 ENCOUNTER — Other Ambulatory Visit: Payer: Self-pay

## 2017-10-15 ENCOUNTER — Emergency Department (INDEPENDENT_AMBULATORY_CARE_PROVIDER_SITE_OTHER)
Admission: EM | Admit: 2017-10-15 | Discharge: 2017-10-15 | Disposition: A | Discharge status: Home / Self Care | Payer: 59 | Source: Home / Self Care | Attending: Family Medicine | Admitting: Family Medicine

## 2017-10-15 DIAGNOSIS — I700 Atherosclerosis of aorta: Secondary | ICD-10-CM | POA: Diagnosis not present

## 2017-10-15 DIAGNOSIS — K5710 Diverticulosis of small intestine without perforation or abscess without bleeding: Secondary | ICD-10-CM | POA: Diagnosis not present

## 2017-10-15 DIAGNOSIS — R109 Unspecified abdominal pain: Secondary | ICD-10-CM

## 2017-10-15 DIAGNOSIS — K449 Diaphragmatic hernia without obstruction or gangrene: Secondary | ICD-10-CM

## 2017-10-15 DIAGNOSIS — I714 Abdominal aortic aneurysm, without rupture, unspecified: Secondary | ICD-10-CM

## 2017-10-15 DIAGNOSIS — K5730 Diverticulosis of large intestine without perforation or abscess without bleeding: Secondary | ICD-10-CM

## 2017-10-15 DIAGNOSIS — K5900 Constipation, unspecified: Secondary | ICD-10-CM

## 2017-10-15 LAB — COMPLETE METABOLIC PANEL WITH GFR
AG Ratio: 1.7 (calc) (ref 1.0–2.5)
ALT: 16 U/L (ref 9–46)
AST: 13 U/L (ref 10–35)
Albumin: 4.1 g/dL (ref 3.6–5.1)
Alkaline phosphatase (APISO): 69 U/L (ref 40–115)
BUN: 13 mg/dL (ref 7–25)
CO2: 24 mmol/L (ref 20–32)
Calcium: 9.4 mg/dL (ref 8.6–10.3)
Chloride: 102 mmol/L (ref 98–110)
Creat: 0.97 mg/dL (ref 0.70–1.33)
GFR, Est African American: 104 mL/min/{1.73_m2} (ref >=60)
GFR, Est Non African American: 89 mL/min/{1.73_m2} (ref >=60)
Globulin: 2.4 g/dL (calc) (ref 1.9–3.7)
Glucose, Bld: 171 mg/dL — ABNORMAL HIGH (ref 65–99)
Potassium: 4.3 mmol/L (ref 3.5–5.3)
Sodium: 135 mmol/L (ref 135–146)
Total Bilirubin: 0.4 mg/dL (ref 0.2–1.2)
Total Protein: 6.5 g/dL (ref 6.1–8.1)

## 2017-10-15 LAB — POCT CBC W AUTO DIFF (K'VILLE URGENT CARE)

## 2017-10-15 MED ORDER — IOPAMIDOL (ISOVUE-300) INJECTION 61%
100.0000 mL | Freq: Once | INTRAVENOUS | Status: AC | PRN
  Administered 2017-10-15: 100 mL via INTRAVENOUS

## 2017-10-15 NOTE — ED Provider Notes (Signed)
[facility]    CSN: [id number] Arrival date & time: 10/15/17  1006     History   Chief Complaint Chief Complaint  Patient presents with  . Abdominal Pain    HPI Cody Flores is a 50 y.o. male.   HPI  Cody Flores is a 51 y.o. male presenting to UC with c/o 10 days constant, gradually worsening Right side abdominal pain and mid back pain.  Pain is constant dull ache that becomes tight and sharp, 4/10 at this time. Nothing seems to make it better or worse. Denies fever, chills, n/v/d but has been constipated for the last 2-3 days.  OTC laxatives have helped. No blood or mucous in stool.  Hx of diverticulitis in the past resulting in a partial colectomy in 2013. Pt is concerned he has diverticulitis again.  Denies hx of kidney stones. Denies urinary symptoms. He still has his appendix and gallbladder.    Past Medical History:  Diagnosis Date  . Diverticulitis   . GERD (gastroesophageal reflux disease)   . HTN (hypertension) 03/05/2017    Patient Active Problem List   Diagnosis Date Noted  . HTN (hypertension) 03/05/2017  . Murmur 03/05/2017  . HLD (hyperlipidemia) 05/15/2016  . Prediabetes 01/04/2016  . Vitamin D deficiency 01/04/2016  . Obese 01/03/2016  . Shortness of breath 01/03/2016  . History of partial colectomy 01/03/2016  . Dry skin 01/03/2016  . COPD (chronic obstructive pulmonary disease) ([place]) 01/03/2016  . SMOKER 01/29/2007  . GERD 01/29/2007    Past Surgical History:  Procedure Laterality Date  . COLECTOMY    . LAPAROSCOPIC SIGMOID COLECTOMY    . VASECTOMY         Home Medications    Prior to Admission medications   Medication Sig Start Date End Date Taking? Authorizing Provider  albuterol (PROVENTIL HFA;VENTOLIN HFA) 108 (90 Base) MCG/ACT inhaler Inhale 2 puffs into the lungs every 6 (six) hours as needed for wheezing or shortness of breath. 04/09/17   Gregor Hams, MD  buPROPion (WELLBUTRIN XL) 150 MG 24 hr tablet Take 1  tablet (150 mg total) by mouth every morning. 12/04/16   Gregor Hams, MD  lansoprazole (PREVACID) 30 MG capsule Take 30 mg by mouth daily at 12 noon.    [provider]  lisinopril (PRINIVIL,ZESTRIL) 20 MG tablet Take 1 tablet (20 mg total) by mouth daily. 05/14/17   Gregor Hams, MD  pravastatin (PRAVACHOL) 40 MG tablet Take 1 tablet (40 mg total) by mouth daily. 05/14/17   Gregor Hams, MD  triamcinolone cream (KENALOG) 0.1 % Apply 1 application topically 2 (two) times daily. 01/03/16   Gregor Hams, MD    Family History Family History  Problem Relation Age of Onset  . Cancer Father   . Aneurysm Father   . Diabetes Maternal Grandmother     Social History Social History   Tobacco Use  . Smoking status: Current Every Day Smoker  . Smokeless tobacco: Never Used  Substance Use Topics  . Alcohol use: Yes    Alcohol/week: 0.0 oz  . Drug use: No     Allergies   Patient has no known allergies.   Review of Systems Review of Systems  Constitutional: Negative for appetite change, chills and fever.  Respiratory: Negative for cough and shortness of breath.   Cardiovascular: Negative for chest pain and palpitations.  Gastrointestinal: Positive for abdominal pain (Right mid to lower side ) and constipation. Negative for diarrhea, nausea  and vomiting.  Genitourinary: Positive for flank pain ( Right). Negative for dysuria, frequency and hematuria.  Musculoskeletal: Positive for back pain ( Right mid). Negative for arthralgias and myalgias.  Skin: Negative for rash.     Physical Exam Triage Vital Signs ED Triage Vitals  Enc Vitals Group     BP 10/15/17 1039 (!) 162/106     Pulse Rate 10/15/17 1039 65     Resp --      Temp 10/15/17 1039 98.1 F (36.7 C)     Temp Source 10/15/17 1039 Oral     SpO2 10/15/17 1039 97 %     Weight 10/15/17 1040 214 lb (97.1 kg)     Height 10/15/17 1040 5\' 9"  (1.753 m)     Head Circumference --      Peak Flow --      Pain Score 10/15/17  1040 4     Pain Loc --      Pain Edu? --      Excl. in [place]? --    No data found.  Updated Vital Signs BP (!) 147/88   Pulse 65   Temp 98.1 F (36.7 C) (Oral)   Ht 5\' 9"  (1.753 m)   Wt 214 lb (97.1 kg)   SpO2 97%   BMI 31.60 kg/m   Visual Acuity Right Eye Distance:   Left Eye Distance:   Bilateral Distance:    Right Eye Near:   Left Eye Near:    Bilateral Near:     Physical Exam  Constitutional: He is oriented to person, place, and time. He appears well-developed and well-nourished.  Non-toxic appearance. He does not appear ill. No distress.  HENT:  Head: Normocephalic and atraumatic.  Mouth/Throat: Oropharynx is clear and moist.  Eyes: EOM are normal.  Neck: Normal range of motion.  Cardiovascular: Normal rate and regular rhythm.  Pulmonary/Chest: Effort normal and breath sounds normal.  Abdominal: Soft. Normal appearance and bowel sounds are normal. There is tenderness in the right lower quadrant and periumbilical area. There is no CVA tenderness.  Musculoskeletal: Normal range of motion.  Neurological: He is alert and oriented to person, place, and time.  Skin: Skin is warm and dry. No rash noted.  Psychiatric: He has a normal mood and affect. His behavior is normal.  Nursing note and vitals reviewed.    UC Treatments / Results  Labs (all labs ordered are listed, but only abnormal results are displayed) Labs Reviewed  COMPLETE METABOLIC PANEL WITH GFR  POCT CBC W AUTO DIFF ([facility])    EKG  EKG Interpretation None       Radiology Ct Abdomen Pelvis W Contrast  Result Date: 10/15/2017 CLINICAL DATA:  51 year old presenting with a 10 day history of sharp right lower quadrant abdominal pain radiating to the back associated with constipation. Surgical history includes sigmoid colectomy for diverticulitis and vasectomy. EXAM: CT ABDOMEN AND PELVIS WITH CONTRAST TECHNIQUE: Multidetector CT imaging of the abdomen and pelvis was performed using the  standard protocol following bolus administration of intravenous contrast. CONTRAST:  129mL ISOVUE-300 IOPAMIDOL INJECTION 61% IV. Oral contrast was also administered. COMPARISON:  None. FINDINGS: Lower chest: Heart size normal.  Visualized lung bases clear. Hepatobiliary: Liver normal in size and appearance. Normal appearing contracted fell scratch de contracted gallbladder which is normal in appearance. No biliary ductal dilation. Pancreas: Normal in appearance without evidence of mass, ductal dilation, or inflammation. Spleen: Normal in size and appearance. Adrenals/Urinary Tract: Normal appearing adrenal glands. Subcentimeter  cortical cysts involving the left kidney. No significant parenchymal abnormality involving either kidney. No hydronephrosis. No urinary tract calculi on either side. Normal appearing urinary bladder. Stomach/Bowel: Stomach normal in appearance for the degree of distention. Small diverticulum arising from the junction of the descending and transverse duodenum, filled with oral contrast. No significant abnormality involving the small bowel. Surgical anastomotic suture material at the colorectal anastomosis in the low pelvis. Rectum decompressed. Scattered diverticula involving the descending colon and the remaining sigmoid colon without evidence of acute diverticulitis. Remainder of the colon normal in appearance. Expected colonic stool burden. Normal appendix in the right upper pelvis. Vascular/Lymphatic: Mild aortoiliac atherosclerosis. Infrarenal abdominal aortic aneurysm extending to the bifurcation with maximum diameter 3.0 cm. Normal-appearing portal venous and systemic venous systems. No pathologic lymphadenopathy. Reproductive: Upper normal sized prostate gland. Normal seminal vesicles. Other: Small hiatal hernia containing intra-abdominal fat. Musculoskeletal: Regional skeleton intact without acute or significant osseous abnormality. IMPRESSION: 1. No acute abnormalities involving the  abdomen or pelvis. 2. Infrarenal abdominal aortic aneurysm which extends to the aortic bifurcation, maximum diameter 3.0 cm. Recommend followup by ultrasound in 3 years. This recommendation follows ACR consensus guidelines: White Paper of the ACR Incidental Findings Committee II on Vascular Findings. J Am Coll Radiol 2013; 10:789-794. 3. Diverticulosis involving the descending colon in the remaining sigmoid colon without evidence of acute diverticulitis. 4. Small duodenal diverticulum at the junction of the descending and transverse duodenum of doubtful clinical significance. 5. Small hiatal hernia containing intra-abdominal fat. Aortic aneurysm NOS (ICD10-I71.9). Aortic Atherosclerosis (ICD10-170.0) Electronically Signed   By: Evangeline Dakin M.D.   On: 10/15/2017 14:30    Procedures Procedures (including critical care time)  Medications Ordered in UC Medications - No data to display   Initial Impression / Assessment and Plan / UC Course  I have reviewed the triage vital signs and the nursing notes.  Pertinent labs & imaging results that were available during my care of the patient were reviewed by me and considered in my medical decision making (see chart for details).     Pt c/o Right side abdominal pain and reports a few days of constipation.  CT abd/pelvis performed to r/o diverticulitis or other acute abnormality.  No acute findings on exam. CT does show diverticulosis, stool burden in Right colon, and AAA measuring 3cm.  Discussed imaging with pt. Pt states his father had an AAA. Encouraged pt to discuss today's results with his PCP to schedule appropriate future imaging/monitoring of AAA  Pain today likely from stool burden. No evidence of acute abdomen F/u with PCP as needed. Discussed symptoms that warrant emergent care in the ED.    Final Clinical Impressions(s) / UC Diagnoses   Final diagnoses:  Right sided abdominal pain  Constipation, unspecified constipation type  AAA  (abdominal aortic aneurysm) without rupture (HCC)  Diverticulosis of colon without hemorrhage    ED Discharge Orders    None       Controlled Substance Prescriptions [place] Controlled Substance Registry consulted? Not Applicable   Tyrell Antonio 10/15/17 1529

## 2017-10-15 NOTE — ED Triage Notes (Signed)
Has been feeling bad for about 10 days with abdominal pain.  He has also had constipation with this.  The pain is on the right side around towards the back.  And it is a sharp pain that goes into tightness.

## 2017-10-15 NOTE — ED Notes (Signed)
Back from CT

## 2017-10-15 NOTE — ED Notes (Signed)
Pt going home until time for CT.

## 2017-10-16 ENCOUNTER — Telehealth: Payer: Self-pay | Admitting: Emergency Medicine

## 2017-10-16 NOTE — Telephone Encounter (Signed)
Left message Inquired about patient's status; encourage them to call with questions/concerns. Informed that CMP normal except for elevated BG which might be due to non-fasting status. Encouraged him to follow with his pcp.

## 2017-11-05 MED FILL — PRAVASTATIN NA 40 MG TAB: 40 | 90 days supply | Qty: 90 | Fill #1

## 2017-12-31 MED FILL — IBUPROFEN 800 MG TAB: 800 | 5 days supply | Qty: 20 | Fill #0

## 2017-12-31 MED FILL — HYDROCODON-APAP 5-325: 5-325 | 5 days supply | Qty: 20 | Fill #0

## 2018-02-05 MED FILL — LISINOPRIL 20 MG TABLET: 20 | 30 days supply | Qty: 30 | Fill #1

## 2018-04-15 ENCOUNTER — Other Ambulatory Visit: Payer: Self-pay | Admitting: Family Medicine

## 2018-04-30 MED FILL — LISINOPRIL 20 MG TABLET: 20 | 30 days supply | Qty: 30 | Fill #0

## 2018-07-19 ENCOUNTER — Other Ambulatory Visit: Payer: Self-pay | Admitting: Family Medicine

## 2018-07-19 MED FILL — LISINOPRIL 20 MG TABLET: 20 | 15 days supply | Qty: 15 | Fill #0

## 2018-09-19 ENCOUNTER — Encounter: Payer: Self-pay | Admitting: Family Medicine

## 2018-11-16 ENCOUNTER — Other Ambulatory Visit: Payer: Self-pay

## 2018-11-16 MED ORDER — LISINOPRIL 20 MG PO TABS: 20.0000 mg | ORAL_TABLET | Freq: Every day | ORAL | 0 refills | Status: DC

## 2018-11-16 MED ORDER — PRAVASTATIN SODIUM 40 MG PO TABS: 40.0000 mg | ORAL_TABLET | Freq: Every day | ORAL | 0 refills | Status: DC

## 2018-11-16 MED FILL — LISINOPRIL 20 MG TABLET: 20 | 30 days supply | Qty: 30 | Fill #0

## 2018-11-16 MED FILL — PRAVASTATIN NA 40 MG TAB: 40 | 30 days supply | Qty: 30 | Fill #0

## 2018-11-29 ENCOUNTER — Encounter: Payer: Self-pay | Admitting: Family Medicine

## 2018-11-29 ENCOUNTER — Ambulatory Visit (INDEPENDENT_AMBULATORY_CARE_PROVIDER_SITE_OTHER): Payer: 59 | Admitting: Family Medicine

## 2018-11-29 VITALS — BP 113/62 | HR 51 | Ht 69.0 in | Wt 206.0 lb

## 2018-11-29 DIAGNOSIS — J439 Emphysema, unspecified: Secondary | ICD-10-CM

## 2018-11-29 DIAGNOSIS — I10 Essential (primary) hypertension: Secondary | ICD-10-CM

## 2018-11-29 DIAGNOSIS — Z6830 Body mass index (BMI) 30.0-30.9, adult: Secondary | ICD-10-CM

## 2018-11-29 DIAGNOSIS — I714 Abdominal aortic aneurysm, without rupture, unspecified: Secondary | ICD-10-CM

## 2018-11-29 DIAGNOSIS — F17200 Nicotine dependence, unspecified, uncomplicated: Secondary | ICD-10-CM

## 2018-11-29 DIAGNOSIS — R7303 Prediabetes: Secondary | ICD-10-CM

## 2018-11-29 DIAGNOSIS — E559 Vitamin D deficiency, unspecified: Secondary | ICD-10-CM | POA: Diagnosis not present

## 2018-11-29 MED ORDER — PRAVASTATIN SODIUM 40 MG PO TABS: 40.0000 mg | ORAL_TABLET | Freq: Every day | ORAL | 3 refills | Status: DC

## 2018-11-29 MED ORDER — ALBUTEROL SULFATE HFA 108 (90 BASE) MCG/ACT IN AERS: 2.0000 | INHALATION_SPRAY | Freq: Four times a day (QID) | RESPIRATORY_TRACT | 11 refills | Status: DC | PRN

## 2018-11-29 MED ORDER — LISINOPRIL 20 MG PO TABS: 20.0000 mg | ORAL_TABLET | Freq: Every day | ORAL | 3 refills | Status: DC

## 2018-11-29 MED FILL — PRAVASTATIN NA 40 MG TAB: 40 | 30 days supply | Qty: 30 | Fill #0

## 2018-11-29 MED FILL — ALBUTEROL SULFATE HFA 108 (: 108 (90 BAS | 25 days supply | Qty: 9 | Fill #0

## 2018-11-29 MED FILL — LISINOPRIL 20 MG TABLET: 20 | 30 days supply | Qty: 30 | Fill #0

## 2018-11-29 NOTE — Patient Instructions (Signed)
Thank you for coming in today. Restart medicines.  Get labs today.  Recheck in 6 -12 months.  Work on cutting back on smoking.  Continue to reduce calories

## 2018-11-29 NOTE — Progress Notes (Signed)
Cody Flores is a 53 y.o. male who presents to [facility] today for hypertension and hyperlipidemia.  Cody Flores was somewhat lost to follow-up.  His last visit was July 2018.  In the interim he had stopped taking his pravastatin and his lisinopril.  He scheduled a recheck and was prescribed a limited supply of lisinopril and pravastatin.  He restarted lisinopril but not restarted pravastatin.  He tolerates the blood pressure medication well with no lightheadedness dizziness chest pain or palpitations.  He has tolerated pravastatin well in the past with no muscle aches or pains.  Patient notes a several day history of cough congestion and runny nose.  He notes this is improving.  No shortness of breath or significant wheezing.  He continues to smoke and is not ready to quit smoking at this time.  Patient notes that he has a incidentally noted 3 cm dominant aortic aneurysm.  This was discovered via CT scan 2018.  He had follow-up November 2019 that was unchanged. ROS as above:  Exam:  BP 113/62   Pulse (!) 51   Ht 5\' 9"  (1.753 m)   Wt 206 lb (93.4 kg)   BMI 30.42 kg/m  Wt Readings from Last 5 Encounters:  11/29/18 206 lb (93.4 kg)  10/15/17 214 lb (97.1 kg)  04/09/17 213 lb (96.6 kg)  03/05/17 214 lb (97.1 kg)  12/04/16 215 lb (97.5 kg)    Gen: Well NAD HEENT: EOMI,  MMM normal posterior pharynx.  Normal tympanic membranes. Lungs: Normal work of breathing. CTABL Heart: Slight bradycardic regular rhythm no MRG Abd: NABS, Soft. Nondistended, Nontender Exts: Brisk capillary refill, warm and well perfused.   CT ABDOMEN AND PELVIS WITH CONTRAST  INDICATION: Left abdominal pain.  COMPARISON:  CT scan 02/13/2012    TECHNIQUE:  CT imaging of the abdomen and pelvis was performed after the intravenous administration of 80 mL of Isovue-370 iodinated contrast. Dose  reduction was utilized (automated exposure control, mA or kV adjustment based on patient size, or  iterative image reconstruction). Coronal and sagittal reformatted images were generated and reviewed.  FINDINGS:   - Lower Thorax: Unremarkable.  - Liver: Normal contour and attenuation.  No focal lesions.  Patent hepatic veins and portal veins.   - Biliary Tree and Gallbladder: No biliary ductal dilatation.  The gallbladder is unremarkable.   - Pancreas: Normal.   - Spleen: Normal.    - Adrenal Glands: Normal.   - Kidneys and Bladder: No hydronephrosis. There are couple small low-attenuation left renal lesions, likely cysts. Unremarkable bladder.  - Gastrointestinal Tract: No bowel obstruction or bowel wall thickening. Status post sigmoid colectomy with patent colorectal anastomosis. Normal appendix.  - Peritoneal Cavity and Retroperitoneum: No free fluid.  No free intraperitoneal air.  - Lymph Nodes: No retroperitoneal, mesenteric or pelvic lymphadenopathy.    - Vasculature: Normal caliber abdominal aorta. Patent proximal aortic branch vessels. Infrarenal abdominal aorta measures 3.1 x 3 cm. Mild atherosclerotic plaque.  - Reproductive: Unremarkable.  - Musculoskeletal: No aggressive appearing osseous lesions.  - Miscellaneous: N/A   Impression: 1.  No acute findings in the abdomen or pelvis.  2.  Minimal aneurysmal enlargement of the abdominal aorta measuring 3.1 cm.  Electronically Signed by: Sol Passer Assessment and Plan: 53 y.o. male with  Hypertension: Blood pressure well controlled.  Continue current regimen.  Check fasting labs listed below.  Hyperlipidemia: Not currently taking pravastatin.  Recheck lipids.  Likely restart pravastatin.  History of prediabetes: Recheck hemoglobin A1c.  Recovering viral URI.  Continue current symptomatic management.  Refill albuterol inhaler in case needed for the future.  Encouraged cutting back on smoking.  Patient  not ready to quit.  Aneurysm: Watchful waiting.  Continue serial management  BCT: Lifestyle modification discussed.  History COPD.  Continue smoking reduction and cessation counseling.  PDMP not reviewed this encounter. Orders Placed This Encounter  Procedures  . CBC  . COMPLETE METABOLIC PANEL WITH GFR  . Lipid Panel w/reflex Direct LDL  . Hemoglobin A1c   Meds ordered this encounter  Medications  . albuterol (PROVENTIL HFA;VENTOLIN HFA) 108 (90 Base) MCG/ACT inhaler    Sig: Inhale 2 puffs into the lungs every 6 (six) hours as needed for wheezing or shortness of breath.    Dispense:  1 Inhaler    Refill:  11  . lisinopril (PRINIVIL,ZESTRIL) 20 MG tablet    Sig: Take 1 tablet (20 mg total) by mouth daily.    Dispense:  90 tablet    Refill:  3  . pravastatin (PRAVACHOL) 40 MG tablet    Sig: Take 1 tablet (40 mg total) by mouth daily.    Dispense:  90 tablet    Refill:  3     Historical information moved to improve visibility of documentation.  Past Medical History:  Diagnosis Date  . Diverticulitis   . GERD (gastroesophageal reflux disease)   . HTN (hypertension) 03/05/2017   Past Surgical History:  Procedure Laterality Date  . COLECTOMY    . LAPAROSCOPIC SIGMOID COLECTOMY    . VASECTOMY     Social History   Tobacco Use  . Smoking status: Current Every Day Smoker  . Smokeless tobacco: Never Used  Substance Use Topics  . Alcohol use: Yes    Alcohol/week: 0.0 standard drinks   family history includes Aneurysm in his father; Cancer in his father; Diabetes in his maternal grandmother.  Medications: Current Outpatient Medications  Medication Sig Dispense Refill  . albuterol (PROVENTIL HFA;VENTOLIN HFA) 108 (90 Base) MCG/ACT inhaler Inhale 2 puffs into the lungs every 6 (six) hours as needed for wheezing or shortness of breath. 1 Inhaler 11  . lansoprazole (PREVACID) 30 MG capsule Take 30 mg by mouth daily at 12 noon.    Marland Kitchen lisinopril (PRINIVIL,ZESTRIL) 20 MG  tablet Take 1 tablet (20 mg total) by mouth daily. 90 tablet 3  . pravastatin (PRAVACHOL) 40 MG tablet Take 1 tablet (40 mg total) by mouth daily. 90 tablet 3  . triamcinolone cream (KENALOG) 0.1 % Apply 1 application topically 2 (two) times daily. 453.6 g 1   No current facility-administered medications for this visit.    No Known Allergies   Discussed warning signs or symptoms. Please see discharge instructions. Patient expresses understanding.

## 2018-11-30 LAB — CBC
HCT: 44.2 % (ref 38.5–50.0)
HEMOGLOBIN: 15 g/dL (ref 13.2–17.1)
MCH: 28.7 pg (ref 27.0–33.0)
MCHC: 33.9 g/dL (ref 32.0–36.0)
MCV: 84.7 fL (ref 80.0–100.0)
MPV: 10.6 fL (ref 7.5–12.5)
PLATELETS: 158 10*3/uL (ref 140–400)
RBC: 5.22 10*6/uL (ref 4.20–5.80)
RDW: 13.8 % (ref 11.0–15.0)
WBC: 3 10*3/uL — ABNORMAL LOW (ref 3.8–10.8)

## 2018-11-30 LAB — COMPLETE METABOLIC PANEL WITH GFR
AG RATIO: 1.6 (calc) (ref 1.0–2.5)
ALBUMIN MSPROF: 4.1 g/dL (ref 3.6–5.1)
ALT: 120 U/L — AB (ref 9–46)
AST: 113 U/L — ABNORMAL HIGH (ref 10–35)
Alkaline phosphatase (APISO): 60 U/L (ref 35–144)
BUN: 17 mg/dL (ref 7–25)
CALCIUM: 8.9 mg/dL (ref 8.6–10.3)
CO2: 29 mmol/L (ref 20–32)
Chloride: 105 mmol/L (ref 98–110)
Creat: 0.9 mg/dL (ref 0.70–1.33)
GFR, EST AFRICAN AMERICAN: 113 mL/min/{1.73_m2} (ref >=60)
GFR, EST NON AFRICAN AMERICAN: 97 mL/min/{1.73_m2} (ref >=60)
GLUCOSE: 111 mg/dL — AB (ref 65–99)
Globulin: 2.5 g/dL (calc) (ref 1.9–3.7)
Potassium: 4.1 mmol/L (ref 3.5–5.3)
Sodium: 140 mmol/L (ref 135–146)
TOTAL PROTEIN: 6.6 g/dL (ref 6.1–8.1)
Total Bilirubin: 0.5 mg/dL (ref 0.2–1.2)

## 2018-11-30 LAB — HEMOGLOBIN A1C
HEMOGLOBIN A1C: 6.2 %{Hb} — AB (ref <5.7)
Mean Plasma Glucose: 131 (calc)
eAG (mmol/L): 7.3 (calc)

## 2018-11-30 LAB — LIPID PANEL W/REFLEX DIRECT LDL
Cholesterol: 183 mg/dL (ref <200)
HDL: 37 mg/dL — ABNORMAL LOW (ref >=40)
LDL CHOLESTEROL (CALC): 120 mg/dL — AB
NON-HDL CHOLESTEROL (CALC): 146 mg/dL — AB (ref <130)
TRIGLYCERIDES: 147 mg/dL (ref <150)
Total CHOL/HDL Ratio: 4.9 (calc) (ref <5.0)

## 2019-02-10 MED FILL — ALBUTEROL SULFATE HFA 108 (: 108 (90 BAS | 25 days supply | Qty: 9 | Fill #1

## 2019-02-10 MED FILL — PRAVASTATIN NA 40 MG TAB: 40 | 30 days supply | Qty: 30 | Fill #1

## 2019-02-10 MED FILL — LISINOPRIL 20 MG TABLET: 20 | 30 days supply | Qty: 30 | Fill #1

## 2019-04-05 MED FILL — PRAVASTATIN NA 40 MG TAB: 40 | 30 days supply | Qty: 30 | Fill #2

## 2019-04-05 MED FILL — LISINOPRIL 20 MG TABLET: 20 | 30 days supply | Qty: 30 | Fill #2

## 2019-04-05 MED FILL — ALBUTEROL SULFATE HFA 108 (: 108 (90 BAS | 25 days supply | Qty: 9 | Fill #2

## 2019-05-09 IMAGING — CT CT ABD-PELV W/ CM
2 of 5 series · 15 of 46 positions shown, 17 images · IV contrast (iopamidol)
Comparison: None.

CLINICAL DATA: 51-year-old presenting with a 10 day history of
sharp right lower quadrant abdominal pain radiating to the back
associated with constipation. Surgical history includes sigmoid
colectomy for diverticulitis and vasectomy.

EXAM:
CT ABDOMEN AND PELVIS WITH CONTRAST
TECHNIQUE: Multidetector CT imaging of the abdomen and pelvis was performed
using the standard protocol following bolus administration of
intravenous contrast.
CONTRAST:  100mL [id number] IOPAMIDOL INJECTION 61% IV. Oral
contrast was also administered.

[Series 2: axial st · axial · 0.97mm/px · z∈[-485,-90]mm · 12 of 91 slices shown, 14 images]
[im 6/91  soft-tissue]
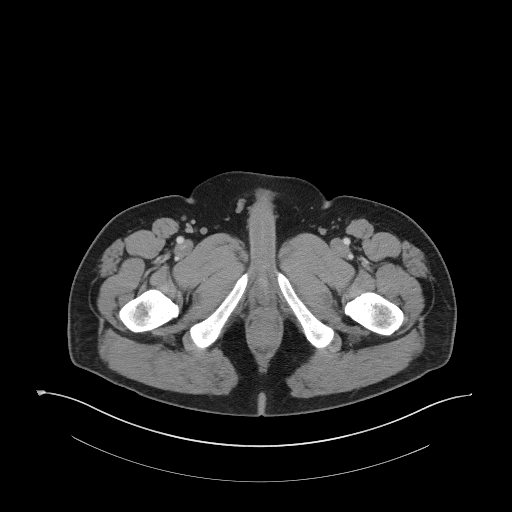
[im 6/91  bone]
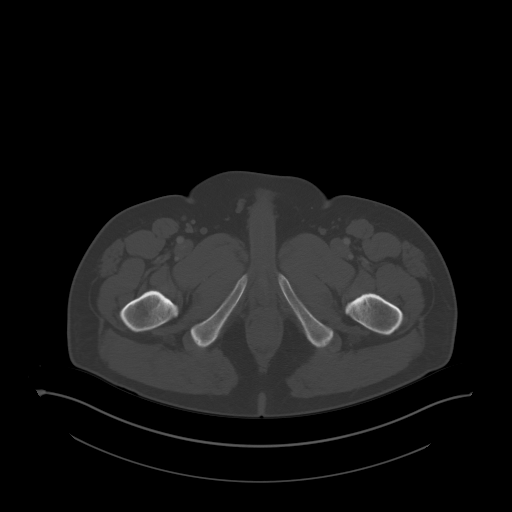
[im 16/91  soft-tissue]
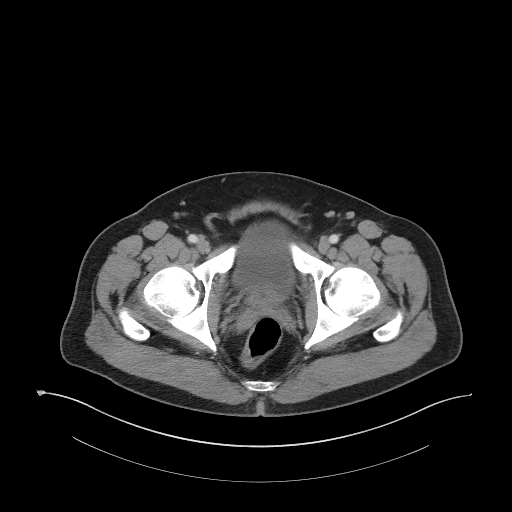
[im 22/91  soft-tissue]
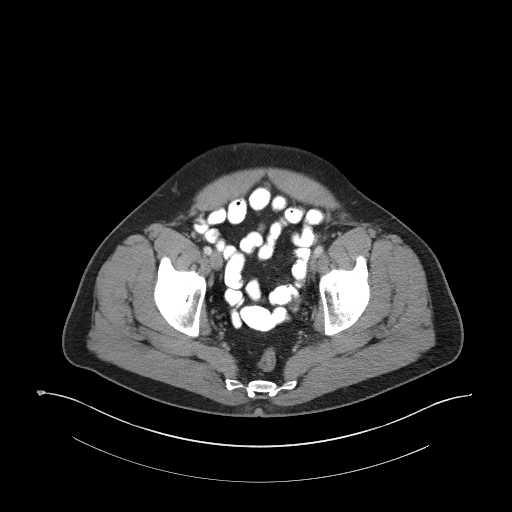
[im 27/91  soft-tissue]
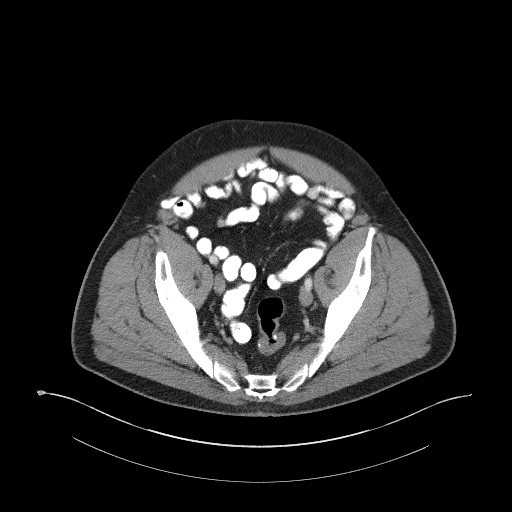
[im 38/91  soft-tissue]
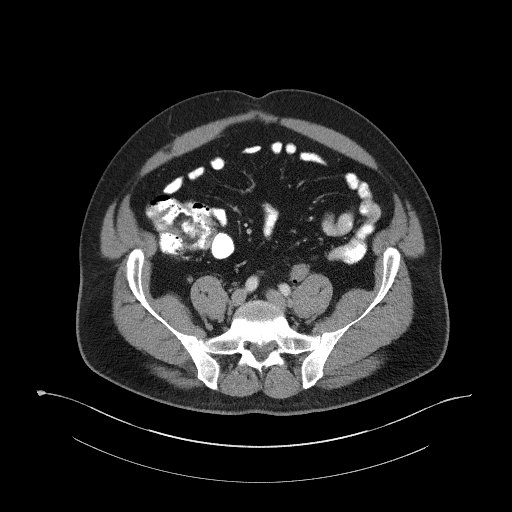
[im 43/91  soft-tissue]
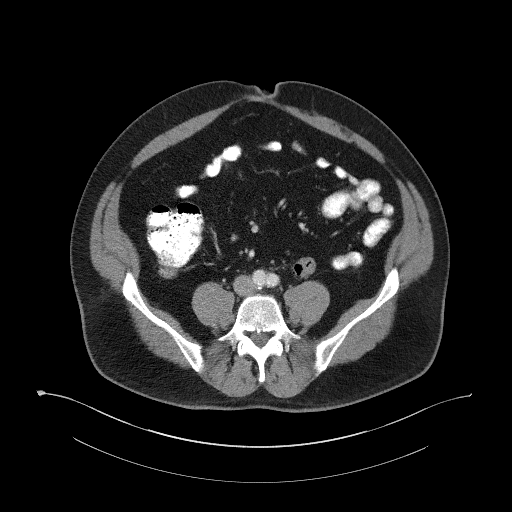
[im 48/91  soft-tissue]
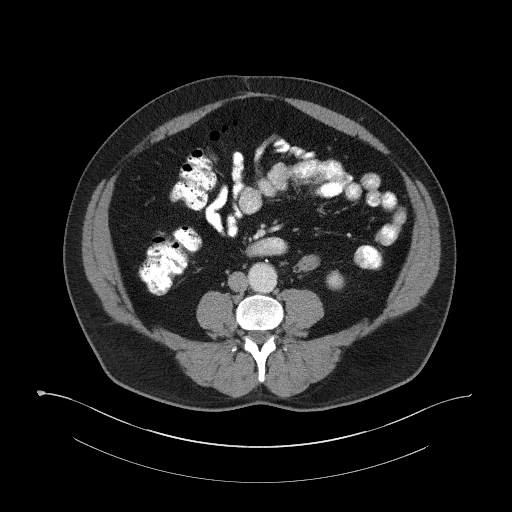
[im 59/91  soft-tissue]
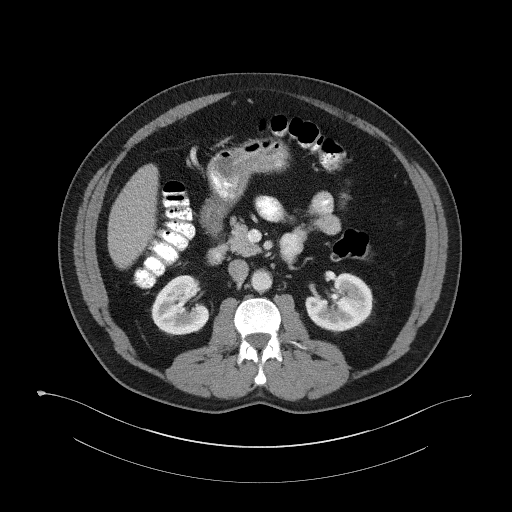
[im 64/91  soft-tissue]
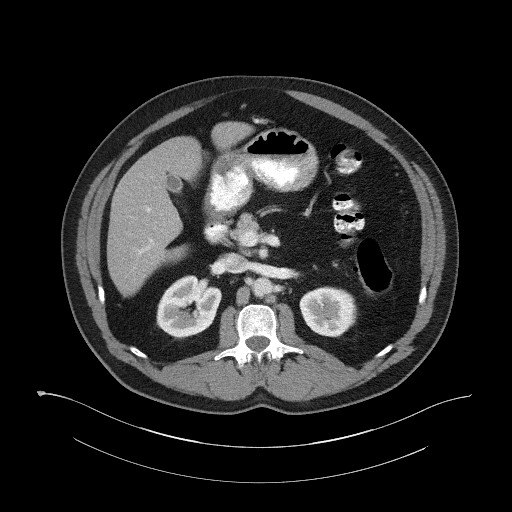
[im 64/91  bone]
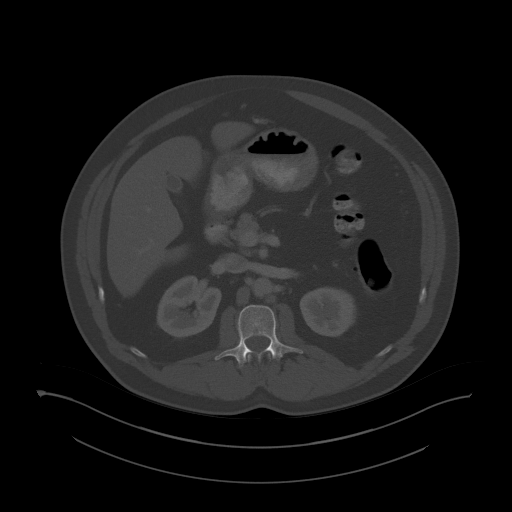
[im 69/91  soft-tissue]
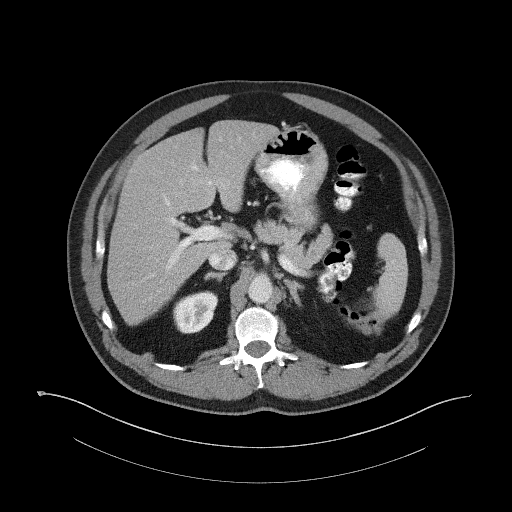
[im 80/91  soft-tissue]
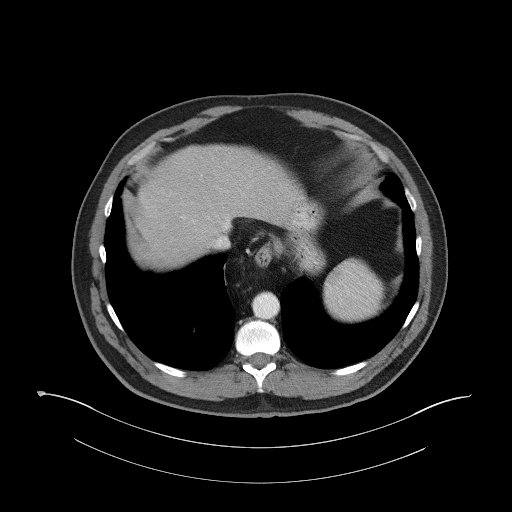
[im 85/91  soft-tissue]
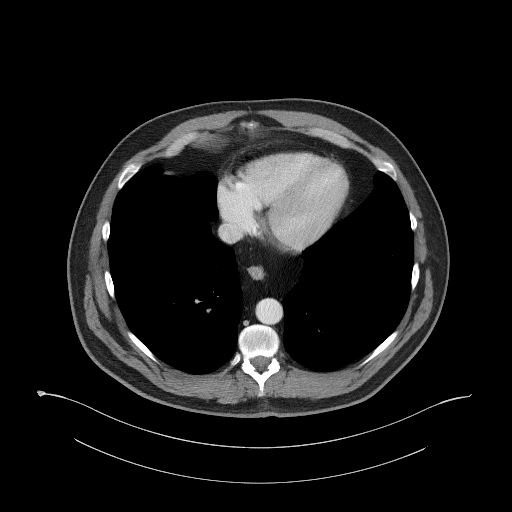

[Series 5: coronal st · coronal · 0.78mm/px · 3 of 114 slices shown]
[im 38/114  soft-tissue]
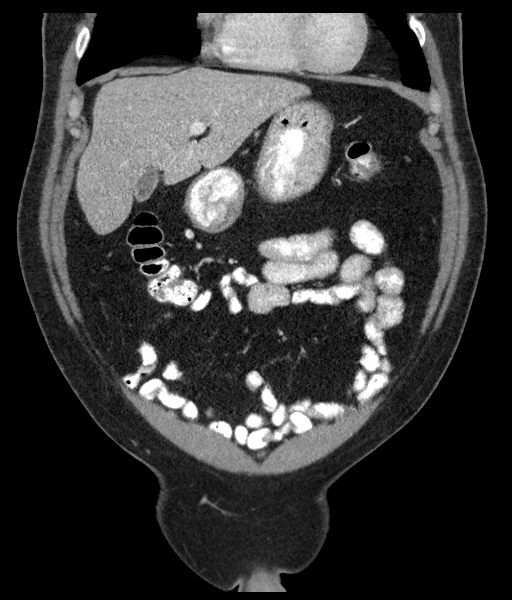
[im 51/114  soft-tissue]
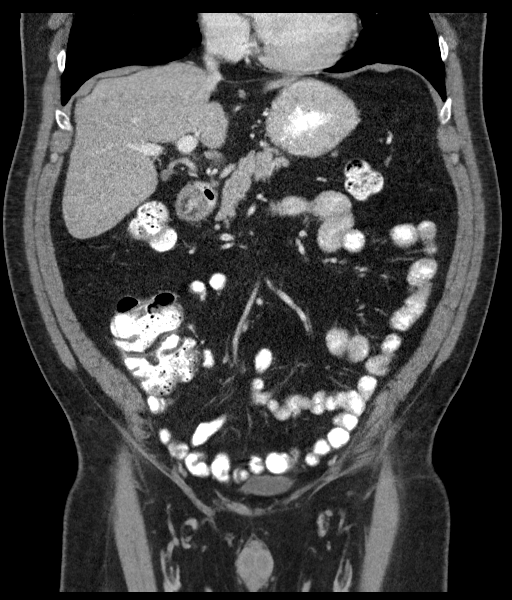
[im 63/114  soft-tissue]
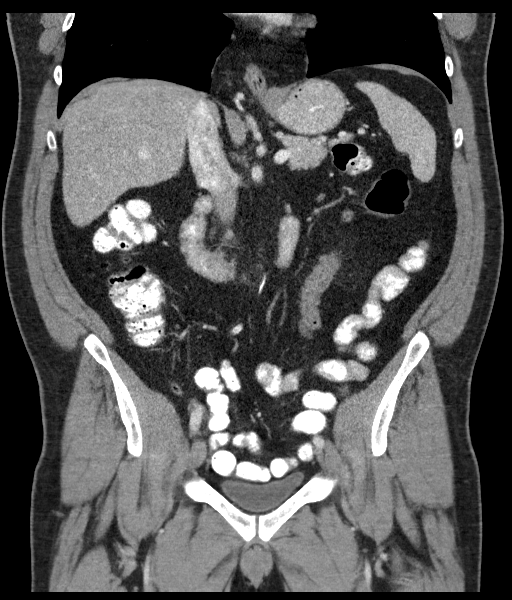

[15 of 46 positions shown; findings below may reference images not displayed]

FINDINGS: Lower chest: Heart size normal.  Visualized lung bases clear.

Hepatobiliary: Liver normal in size and appearance. Normal appearing
contracted fell scratch de contracted gallbladder which is normal in
appearance. No biliary ductal dilation.

Pancreas: Normal in appearance without evidence of mass, ductal
dilation, or inflammation.

Spleen: Normal in size and appearance.

Adrenals/Urinary Tract: Normal appearing adrenal glands.
Subcentimeter cortical cysts involving the left kidney. No
significant parenchymal abnormality involving either kidney. No
hydronephrosis. No urinary tract calculi on either side. Normal
appearing urinary bladder.

Stomach/Bowel: Stomach normal in appearance for the degree of
distention. Small diverticulum arising from the junction of the
descending and transverse duodenum, filled with oral contrast. No
significant abnormality involving the small bowel. Surgical
anastomotic suture material at the colorectal anastomosis in the low
pelvis. Rectum decompressed. Scattered diverticula involving the
descending colon and the remaining sigmoid colon without evidence of
acute diverticulitis. Remainder of the colon normal in appearance.
Expected colonic stool burden. Normal appendix in the right upper
pelvis.

Vascular/Lymphatic: Mild aortoiliac atherosclerosis. Infrarenal
abdominal aortic aneurysm extending to the bifurcation with maximum
diameter 3.0 cm. Normal-appearing portal venous and systemic venous
systems.

No pathologic lymphadenopathy.

Reproductive: Upper normal sized prostate gland. Normal seminal
vesicles.

Other: Small hiatal hernia containing intra-abdominal fat.

Musculoskeletal: Regional skeleton intact without acute or
significant osseous abnormality.
IMPRESSION: 1. No acute abnormalities involving the abdomen or pelvis.
2. Infrarenal abdominal aortic aneurysm which extends to the aortic
bifurcation, maximum diameter 3.0 cm. Recommend followup by
ultrasound in 3 years. This recommendation follows ACR consensus
guidelines: White Paper of the ACR Incidental Findings Committee II
on Vascular Findings. [HOSPITAL] 7009; [DATE].
3. Diverticulosis involving the descending colon in the remaining
sigmoid colon without evidence of acute diverticulitis.
4. Small duodenal diverticulum at the junction of the descending and
transverse duodenum of doubtful clinical significance.
5. Small hiatal hernia containing intra-abdominal fat.
Aortic aneurysm NOS (XPASB-4AU.I).

Aortic Atherosclerosis (XPASB-170.0)

## 2019-05-25 MED FILL — PRAVASTATIN NA 40 MG TAB: 40 | 30 days supply | Qty: 30 | Fill #3

## 2019-05-25 MED FILL — LISINOPRIL 20 MG TABLET: 20 | 30 days supply | Qty: 30 | Fill #3

## 2019-05-30 ENCOUNTER — Ambulatory Visit: Payer: 59 | Admitting: Family Medicine

## 2019-05-31 ENCOUNTER — Encounter: Payer: Self-pay | Admitting: Family Medicine

## 2019-05-31 ENCOUNTER — Other Ambulatory Visit: Payer: Self-pay

## 2019-05-31 ENCOUNTER — Ambulatory Visit (INDEPENDENT_AMBULATORY_CARE_PROVIDER_SITE_OTHER): Payer: No Typology Code available for payment source | Admitting: Family Medicine

## 2019-05-31 VITALS — BP 117/78 | HR 52 | Temp 98.0°F | Wt 201.0 lb

## 2019-05-31 DIAGNOSIS — Z6829 Body mass index (BMI) 29.0-29.9, adult: Secondary | ICD-10-CM

## 2019-05-31 DIAGNOSIS — I10 Essential (primary) hypertension: Secondary | ICD-10-CM | POA: Diagnosis not present

## 2019-05-31 DIAGNOSIS — J439 Emphysema, unspecified: Secondary | ICD-10-CM | POA: Diagnosis not present

## 2019-05-31 DIAGNOSIS — Z23 Encounter for immunization: Secondary | ICD-10-CM | POA: Diagnosis not present

## 2019-05-31 DIAGNOSIS — Z0000 Encounter for general adult medical examination without abnormal findings: Secondary | ICD-10-CM | POA: Diagnosis not present

## 2019-05-31 DIAGNOSIS — E782 Mixed hyperlipidemia: Secondary | ICD-10-CM

## 2019-05-31 DIAGNOSIS — I714 Abdominal aortic aneurysm, without rupture, unspecified: Secondary | ICD-10-CM | POA: Insufficient documentation

## 2019-05-31 DIAGNOSIS — R7303 Prediabetes: Secondary | ICD-10-CM

## 2019-05-31 DIAGNOSIS — Z1211 Encounter for screening for malignant neoplasm of colon: Secondary | ICD-10-CM

## 2019-05-31 HISTORY — DX: Abdominal aortic aneurysm, without rupture: I71.4

## 2019-05-31 HISTORY — DX: Abdominal aortic aneurysm, without rupture, unspecified: I71.40

## 2019-05-31 NOTE — Patient Instructions (Signed)
Thank you for coming in today. Please get fasting labs. You should her from gastroenterology soon.  Let me know if you do not hear anything.   Work on smoking reduction.   We will do Pneumovac 23 vaccine today.   Consider flu vaccine later this year.   Recheck in 6 months or sooner if needed.

## 2019-05-31 NOTE — Progress Notes (Addendum)
Cody Flores is a 53 y.o. male who presents to [facility] today for well adult visit.   Patient has several chronic medical conditions including hypertension hyperlipidemia stable abdominal or aortic aneurysm, mild COPD and smoking.  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  He finds this very stressful.  As result he is increased to smoking a little bit.   Hyperlipidemia doing well with pravastatin.  Hypertension: Taking lisinopril.  Tolerating well with no issues.  Additionally patient has a small abdominal aortic aneurysm measuring about 3 cm when last seen November 2019.  This is been unchanged for several years now.  Patient has a history of COPD and currently smokes.  He denies significant wheezing.  ROS as above:  Exam:  BP 117/78   Pulse (!) 52   Temp 98 F (36.7 C) (Oral)   Wt 201 lb (91.2 kg)   BMI 29.68 kg/m  Wt Readings from Last 5 Encounters:  05/31/19 201 lb (91.2 kg)  11/29/18 206 lb (93.4 kg)  10/15/17 214 lb (97.1 kg)  04/09/17 213 lb (96.6 kg)  03/05/17 214 lb (97.1 kg)    Gen: Well NAD HEENT: EOMI,  MMM Lungs: Normal work of breathing. CTABL Heart: RRR no MRG Abd: NABS, Soft. Nondistended, Nontender Exts: Brisk capillary refill, warm and well perfused.   Lab and Radiology Results No results found for this or any previous visit (from the past 72 hour(s)). No results found.    Assessment and Plan: 53 y.o. male with  Well adult.  Doing reasonably well.  Will administer pneumococcal vaccine today given history of COPD.  Pneumovax 23 given.  Also will check basic fasting labs to follow-up his chronic medical issues and conditions including CBC metabolic panel lipid panel and A1c.   Refer to gastroenterology.  Patient is due for repeat colonoscopy.  Last colonoscopy was done at digestive health specialist in [facility] 2013 however patient now has health insurance through [facility] and will prefer [place] gastro[facility]ology.  Recheck with me in 6 months.  Return sooner if needed.  Work on smoking cessation and healthy diet lifestyle.  Addendum: Labs came back the following day showing LDL 107.  This is above his goal of 70.  He has atherosclerosis of the aorta along with a mild aortic aneurysm.  Plan to switch from pravastatin 40 to Crestor 10.  Plan to recheck lipid panel and metabolic panel in 6 to 12 weeks  PDMP not reviewed this encounter. Orders Placed This Encounter  Procedures  . Pneumococcal polysaccharide vaccine 23-valent greater than or equal to 2yo subcutaneous/IM  . CBC  . COMPLETE METABOLIC PANEL WITH GFR  . Lipid Panel w/reflex Direct LDL  . Hemoglobin A1c  . Ambulatory referral to Gastroenterology    Referral Priority:   Routine    Referral Type:   Consultation    Referral Reason:   Specialty Services Required    Referred to Provider:   Lavena Bullion, DO    Number of Visits Requested:   1   No orders of the defined types were placed in this encounter.    Historical information moved to improve visibility of documentation.  Past Medical History:  Diagnosis Date  . Abdominal aortic aneurysm (AAA) ([place]) 05/31/2019  3 cm.  Unchanged November 2018  . Diverticulitis   . GERD (gastroesophageal reflux disease)   . HTN (hypertension) 03/05/2017   Past Surgical History:  Procedure Laterality Date  . COLECTOMY    . LAPAROSCOPIC SIGMOID COLECTOMY    . VASECTOMY     Social History   Tobacco Use  . Smoking status: Current Every Day Smoker    Packs/day: 1.50    Types: Cigarettes  . Smokeless tobacco: Never Used  Substance Use Topics  . Alcohol use: Yes    Alcohol/week: 0.0 standard drinks   family history includes Aneurysm in his father;  Cancer in his father; Diabetes in his maternal grandmother.  Medications: Current Outpatient Medications  Medication Sig Dispense Refill  . albuterol (PROVENTIL HFA;VENTOLIN HFA) 108 (90 Base) MCG/ACT inhaler Inhale 2 puffs into the lungs every 6 (six) hours as needed for wheezing or shortness of breath. 1 Inhaler 11  . lansoprazole (PREVACID) 30 MG capsule Take 30 mg by mouth daily at 12 noon.    [place] Kitchen lisinopril (PRINIVIL,ZESTRIL) 20 MG tablet Take 1 tablet (20 mg total) by mouth daily. 90 tablet 3  . pravastatin (PRAVACHOL) 40 MG tablet Take 1 tablet (40 mg total) by mouth daily. 90 tablet 3  . triamcinolone cream (KENALOG) 0.1 % Apply 1 application topically 2 (two) times daily. 453.6 g 1   No current facility-administered medications for this visit.    No Known Allergies   Discussed warning signs or symptoms. Please see discharge instructions. Patient expresses understanding.

## 2019-06-01 LAB — COMPLETE METABOLIC PANEL WITH GFR
AG Ratio: 1.8 (calc) (ref 1.0–2.5)
ALT: 14 U/L (ref 9–46)
AST: 13 U/L (ref 10–35)
Albumin: 4.4 g/dL (ref 3.6–5.1)
Alkaline phosphatase (APISO): 68 U/L (ref 35–144)
BUN: 20 mg/dL (ref 7–25)
CO2: 28 mmol/L (ref 20–32)
Calcium: 9.7 mg/dL (ref 8.6–10.3)
Chloride: 104 mmol/L (ref 98–110)
Creat: 1.05 mg/dL (ref 0.70–1.33)
GFR, Est African American: 93 mL/min/{1.73_m2} (ref >=60)
GFR, Est Non African American: 80 mL/min/{1.73_m2} (ref >=60)
Globulin: 2.5 g/dL (calc) (ref 1.9–3.7)
Glucose, Bld: 112 mg/dL — ABNORMAL HIGH (ref 65–99)
Potassium: 4.9 mmol/L (ref 3.5–5.3)
Sodium: 139 mmol/L (ref 135–146)
Total Bilirubin: 0.4 mg/dL (ref 0.2–1.2)
Total Protein: 6.9 g/dL (ref 6.1–8.1)

## 2019-06-01 LAB — LIPID PANEL W/REFLEX DIRECT LDL
Cholesterol: 163 mg/dL (ref <200)
HDL: 38 mg/dL — ABNORMAL LOW (ref >=40)
LDL Cholesterol (Calc): 107 mg/dL (calc) — ABNORMAL HIGH
Non-HDL Cholesterol (Calc): 125 mg/dL (calc) (ref <130)
Total CHOL/HDL Ratio: 4.3 (calc) (ref <5.0)
Triglycerides: 86 mg/dL (ref <150)

## 2019-06-01 LAB — HEMOGLOBIN A1C
Hgb A1c MFr Bld: 5.9 % of total Hgb — ABNORMAL HIGH (ref <5.7)
Mean Plasma Glucose: 123 (calc)
eAG (mmol/L): 6.8 (calc)

## 2019-06-01 LAB — CBC
HCT: 45.5 % (ref 38.5–50.0)
Hemoglobin: 15.3 g/dL (ref 13.2–17.1)
MCH: 29.4 pg (ref 27.0–33.0)
MCHC: 33.6 g/dL (ref 32.0–36.0)
MCV: 87.3 fL (ref 80.0–100.0)
MPV: 11.1 fL (ref 7.5–12.5)
Platelets: 220 10*3/uL (ref 140–400)
RBC: 5.21 10*6/uL (ref 4.20–5.80)
RDW: 13.8 % (ref 11.0–15.0)
WBC: 7.8 10*3/uL (ref 3.8–10.8)

## 2019-06-01 MED ORDER — ROSUVASTATIN CALCIUM 10 MG PO TABS: 10.0000 mg | ORAL_TABLET | Freq: Every day | ORAL | 1 refills | Status: DC

## 2019-06-01 MED FILL — ROSUVASTATIN CALCIUM 10 MG: 10 | 90 days supply | Qty: 90 | Fill #0

## 2019-06-01 NOTE — Addendum Note (Signed)
Addended by: Gregor Hams on: 06/01/2019 06:44 AM   Modules accepted: Orders

## 2019-06-28 MED FILL — LISINOPRIL 20 MG TABLET: 20 | 30 days supply | Qty: 30 | Fill #4

## 2019-07-27 ENCOUNTER — Telehealth: Payer: Self-pay | Admitting: Family Medicine

## 2019-07-27 DIAGNOSIS — Z5181 Encounter for therapeutic drug level monitoring: Secondary | ICD-10-CM

## 2019-07-27 DIAGNOSIS — E782 Mixed hyperlipidemia: Secondary | ICD-10-CM

## 2019-07-27 NOTE — Telephone Encounter (Signed)
-----   Message from Gregor Hams, MD sent at 06/01/2019  6:44 AM EDT ----- Regarding: Recheck lipid panel and metabolic panel 6 to 8 weeks after starting Crestor Recheck lipid panel and metabolic panel 6 to 8 weeks after starting Crestor

## 2019-07-27 NOTE — Telephone Encounter (Signed)
Plan to check lipid panel and liver function after starting Crestor.  Labs should be done fasting in the near future.  Lab opens at 8 AM and you do not need an appointment.

## 2019-07-27 NOTE — Telephone Encounter (Signed)
Left pt msg to get labs done fasting

## 2019-08-01 ENCOUNTER — Encounter: Payer: Self-pay | Admitting: Gastroenterology

## 2019-08-01 ENCOUNTER — Other Ambulatory Visit: Payer: Self-pay

## 2019-08-01 DIAGNOSIS — Z20822 Contact with and (suspected) exposure to covid-19: Secondary | ICD-10-CM

## 2019-08-02 LAB — COMPLETE METABOLIC PANEL WITH GFR
AG Ratio: 1.7 (calc) (ref 1.0–2.5)
ALT: 15 U/L (ref 9–46)
AST: 16 U/L (ref 10–35)
Albumin: 4.5 g/dL (ref 3.6–5.1)
Alkaline phosphatase (APISO): 67 U/L (ref 35–144)
BUN: 17 mg/dL (ref 7–25)
CO2: 27 mmol/L (ref 20–32)
Calcium: 9.7 mg/dL (ref 8.6–10.3)
Chloride: 103 mmol/L (ref 98–110)
Creat: 1.03 mg/dL (ref 0.70–1.33)
GFR, Est African American: 95 mL/min/{1.73_m2} (ref >=60)
GFR, Est Non African American: 82 mL/min/{1.73_m2} (ref >=60)
Globulin: 2.6 g/dL (calc) (ref 1.9–3.7)
Glucose, Bld: 109 mg/dL — ABNORMAL HIGH (ref 65–99)
Potassium: 4.8 mmol/L (ref 3.5–5.3)
Sodium: 139 mmol/L (ref 135–146)
Total Bilirubin: 0.4 mg/dL (ref 0.2–1.2)
Total Protein: 7.1 g/dL (ref 6.1–8.1)

## 2019-08-02 LAB — LIPID PANEL W/REFLEX DIRECT LDL
Cholesterol: 131 mg/dL (ref <200)
HDL: 41 mg/dL (ref >=40)
LDL Cholesterol (Calc): 76 mg/dL (calc)
Non-HDL Cholesterol (Calc): 90 mg/dL (calc) (ref <130)
Total CHOL/HDL Ratio: 3.2 (calc) (ref <5.0)
Triglycerides: 67 mg/dL (ref <150)

## 2019-08-03 LAB — NOVEL CORONAVIRUS, NAA: SARS-CoV-2, NAA: NOT DETECTED

## 2019-08-15 ENCOUNTER — Ambulatory Visit: Payer: No Typology Code available for payment source | Admitting: Gastroenterology

## 2019-08-15 MED FILL — LISINOPRIL 20 MG TABLET: 20 | 30 days supply | Qty: 30 | Fill #5

## 2019-08-17 ENCOUNTER — Telehealth: Payer: Self-pay | Admitting: Family Medicine

## 2019-08-17 NOTE — Telephone Encounter (Signed)
Received records regarding colonoscopy from digestive health specialist. Good colonoscopy in June 2013.   You had adenoma polyps that are potentially precancerous.  They wanted to recheck your colonoscopy in 3 years which would be 2016. You are overdue for repeat colonoscopy.  Recommend you contact digestive health specialist again at [phone].  Additionally I could refer you to a different gastroenterology office if you would like.

## 2019-08-17 NOTE — Telephone Encounter (Signed)
Left message advising of recommendations.  

## 2019-09-05 ENCOUNTER — Ambulatory Visit: Payer: No Typology Code available for payment source | Admitting: Gastroenterology

## 2019-09-13 MED FILL — LISINOPRIL 20 MG TABLET: 20 | 30 days supply | Qty: 30 | Fill #6

## 2019-09-13 MED FILL — ROSUVASTATIN CALCIUM 10 MG: 10 | 90 days supply | Qty: 90 | Fill #1

## 2019-09-28 ENCOUNTER — Encounter: Payer: Self-pay | Admitting: Family Medicine

## 2019-10-06 ENCOUNTER — Encounter: Payer: Self-pay | Admitting: Family Medicine

## 2019-12-05 ENCOUNTER — Ambulatory Visit: Payer: No Typology Code available for payment source | Admitting: Family Medicine

## 2019-12-26 ENCOUNTER — Other Ambulatory Visit: Payer: Self-pay

## 2019-12-26 ENCOUNTER — Encounter: Payer: Self-pay | Admitting: Family Medicine

## 2019-12-26 ENCOUNTER — Ambulatory Visit (INDEPENDENT_AMBULATORY_CARE_PROVIDER_SITE_OTHER): Payer: No Typology Code available for payment source | Admitting: Family Medicine

## 2019-12-26 ENCOUNTER — Telehealth: Payer: Self-pay | Admitting: Gastroenterology

## 2019-12-26 VITALS — BP 145/90 | HR 60 | Temp 97.9°F | Ht 69.0 in | Wt 208.0 lb

## 2019-12-26 DIAGNOSIS — Z9049 Acquired absence of other specified parts of digestive tract: Secondary | ICD-10-CM

## 2019-12-26 DIAGNOSIS — I10 Essential (primary) hypertension: Secondary | ICD-10-CM | POA: Diagnosis not present

## 2019-12-26 DIAGNOSIS — Z1211 Encounter for screening for malignant neoplasm of colon: Secondary | ICD-10-CM | POA: Diagnosis not present

## 2019-12-26 DIAGNOSIS — J439 Emphysema, unspecified: Secondary | ICD-10-CM | POA: Diagnosis not present

## 2019-12-26 DIAGNOSIS — I714 Abdominal aortic aneurysm, without rupture, unspecified: Secondary | ICD-10-CM

## 2019-12-26 DIAGNOSIS — E782 Mixed hyperlipidemia: Secondary | ICD-10-CM

## 2019-12-26 DIAGNOSIS — F17210 Nicotine dependence, cigarettes, uncomplicated: Secondary | ICD-10-CM

## 2019-12-26 MED ORDER — ROSUVASTATIN CALCIUM 10 MG PO TABS: 10.0000 mg | ORAL_TABLET | Freq: Every day | ORAL | 3 refills | Status: DC

## 2019-12-26 MED ORDER — CHANTIX STARTING MONTH PAK 0.5 MG X 11 & 1 MG X 42 PO TABS: ORAL_TABLET | ORAL | 0 refills | Status: DC

## 2019-12-26 MED ORDER — VARENICLINE TARTRATE 1 MG PO TABS: 1.0000 mg | ORAL_TABLET | Freq: Two times a day (BID) | ORAL | 1 refills | Status: DC

## 2019-12-26 MED ORDER — LISINOPRIL 20 MG PO TABS: 20.0000 mg | ORAL_TABLET | Freq: Every day | ORAL | 3 refills | Status: DC

## 2019-12-26 MED FILL — LISINOPRIL 20 MG TABLET: 20 | 90 days supply | Qty: 90 | Fill #0

## 2019-12-26 MED FILL — ROSUVASTATIN CALCIUM 10 MG: 10 | 90 days supply | Qty: 90 | Fill #0

## 2019-12-26 MED FILL — CHANTIX STARTING MONTH BOX: 0.5 MG X 11 | 30 days supply | Qty: 53 | Fill #0

## 2019-12-26 NOTE — Assessment & Plan Note (Signed)
Counseled on smoking cessation.  Will start chantix to assist with his effort to quit.

## 2019-12-26 NOTE — Assessment & Plan Note (Signed)
Blood pressure is not at goal at for age and co-morbidities.  I recommend he restart lisinopril at previous dose.  In addition they were instructed to follow a low sodium diet with regular exercise to help to maintain adequate control of blood pressure.

## 2019-12-26 NOTE — Patient Instructions (Signed)
Great to meet you today! Please continue current medications. We'll be in touch with results of your labwork.  Work on quitting smoking-this will help with your COPD and help reduce the chance of your aneurysm enlarging.  We need to get an ultrasound of your aorta towards the end of the year, we'll talk about scheduling this at your next visit in about 6 months.

## 2019-12-26 NOTE — Assessment & Plan Note (Signed)
Due for updated [place] in 08/2020.

## 2019-12-26 NOTE — Assessment & Plan Note (Signed)
Lab Results  Component Value Date   LDLCALC 76 08/01/2019  Doing well with crestor, continue at current dose.

## 2019-12-26 NOTE — Assessment & Plan Note (Signed)
2/2 to diverticulitis.  Overdue for colon cancer screening, referral to GI entered.

## 2019-12-26 NOTE — Assessment & Plan Note (Signed)
Counseled on smoking cessation.  Continue albuterol PRN.

## 2019-12-26 NOTE — Progress Notes (Signed)
Cody Flores - 54 y.o. male MRN [id number]  Date of birth: Mar 08, 1966  Subjective Chief Complaint  Patient presents with  . Hyperlipidemia    HPI Cody Flores is a 55 y.o. male with history of HTN, HLD, COPD and AAA here today for routine follow up.   He reports that overall he is doing well.  He has had some LUQ pain for the past few weeks.  This is off and on.  Worse after moving some furniture a few days ago.  Denies changes to bowels or worsening reflux symptoms.  He is overdue for colonoscopy. He would like to get this set up.    He has history of AAA, ~3cm on last imaging study in 09/2017.  Due for updated [place] at the end of this year.  Unfortunately he continues to smoke, up to 1.5 ppd.  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  He has completed this and would like to cut back.  Had some success with chantix previously, requests new rx.   He has been out of medication for HTN for about 1 month.  He does not monitor BP at home.  He denies chest pain, headache, shortness of breath, or vision changes.   Compliant with crestor.  Denies myalgias with current dose.   ROS:  A comprehensive ROS was completed and negative except as noted per HPI  No Known Allergies  Past Medical History:  Diagnosis Date  . Abdominal aortic aneurysm (AAA) ([place]) 05/31/2019   3 cm.  Unchanged November 2018  . Diverticulitis   . GERD (gastroesophageal reflux disease)   . HTN (hypertension) 03/05/2017    Past Surgical History:  Procedure Laterality Date  . COLECTOMY    . LAPAROSCOPIC SIGMOID COLECTOMY    . VASECTOMY      Social History   Socioeconomic History  . Marital status: Married    Spouse name: Not on file  . Number of children: Not on file  . Years of education: Not on file  . Highest education level: Not on file  Occupational History  . Not on file  Tobacco Use  . Smoking status: Current Every Day Smoker    Packs/day: 1.50     Types: Cigarettes  . Smokeless tobacco: Never Used  Substance and Sexual Activity  . Alcohol use: Yes    Alcohol/week: 0.0 standard drinks  . Drug use: No  . Sexual activity: Not on file  Other Topics Concern  . Not on file  Social History Narrative   ** Merged History Encounter **       Social Determinants of Health   Financial Resource Strain:   . Difficulty of Paying Living Expenses: Not on file  Food Insecurity:   . Worried About Charity fundraiser in the Last Year: Not on file  . Ran Out of Food in the Last Year: Not on file  Transportation Needs:   . Lack of Transportation (Medical): Not on file  . Lack of Transportation (Non-Medical): Not on file  Physical Activity:   . Days of Exercise per Week: Not on file  . Minutes of Exercise per Session: Not on file  Stress:   . Feeling of Stress : Not on file  Social Connections:   . Frequency of Communication with Friends and Family: Not on file  . Frequency of Social Gatherings with Friends and Family: Not on file  . Attends Religious Services:  Not on file  . Active Member of Clubs or Organizations: Not on file  . Attends Archivist Meetings: Not on file  . Marital Status: Not on file    Family History  Problem Relation Age of Onset  . Cancer Father   . Aneurysm Father   . Diabetes Maternal Grandmother     Health Maintenance  Topic Date Due  . HIV Screening  01/12/1980  . COLONOSCOPY  04/14/2015  . INFLUENZA VACCINE  05/28/2019  . TETANUS/TDAP  01/02/2026     ----------------------------------------------------------------------------------------------------------------------------------------------------------------------------------------------------------------- Physical Exam BP (!) 145/90   Pulse 60   Temp 97.9 F (36.6 C) (Oral)   Ht 5\' 9"  (1.753 m)   Wt 208 lb (94.3 kg)   BMI 30.72 kg/m   Physical Exam Constitutional:      Appearance: Normal appearance.  Eyes:     General: No  scleral icterus. Cardiovascular:     Rate and Rhythm: Normal rate and regular rhythm.  Pulmonary:     Effort: Pulmonary effort is normal.     Breath sounds: Normal breath sounds.  Abdominal:     General: Bowel sounds are normal. There is no distension.     Palpations: Abdomen is soft.     Tenderness: There is no abdominal tenderness.     Hernia: No hernia is present.     Comments: Diastasis recti noted.   Musculoskeletal:     Cervical back: Neck supple.  Skin:    General: Skin is warm and dry.  Neurological:     General: No focal deficit present.     Mental Status: He is alert.  Psychiatric:        Mood and Affect: Mood normal.        Behavior: Behavior normal.     ------------------------------------------------------------------------------------------------------------------------------------------------------------------------------------------------------------------- Assessment and Plan  HTN (hypertension) Blood pressure is not at goal at for age and co-morbidities.  I recommend he restart lisinopril at previous dose.  In addition they were instructed to follow a low sodium diet with regular exercise to help to maintain adequate control of blood pressure.    Abdominal aortic aneurysm (AAA) ([place]) Due for updated [place] in 08/2020.   COPD (chronic obstructive pulmonary disease) ([place]) Counseled on smoking cessation.  Continue albuterol PRN.   Nicotine dependence Counseled on smoking cessation.  Will start chantix to assist with his effort to quit.   HLD (hyperlipidemia) Lab Results  Component Value Date   LDLCALC 76 08/01/2019  Doing well with crestor, continue at current dose.   History of partial colectomy 2/2 to diverticulitis.  Overdue for colon cancer screening, referral to GI entered.    Meds ordered this encounter  Medications  . lisinopril (ZESTRIL) 20 MG tablet    Sig: Take 1 tablet (20 mg total) by mouth daily.    Dispense:  90 tablet    Refill:  3   . rosuvastatin (CRESTOR) 10 MG tablet    Sig: Take 1 tablet (10 mg total) by mouth daily.    Dispense:  90 tablet    Refill:  3    Replaces pravastatin  . varenicline (CHANTIX STARTING MONTH PAK) 0.5 MG X 11 & 1 MG X 42 tablet    Sig: Take one 0.5 mg tablet by mouth once daily for 3 days, then increase to one 0.5 mg tablet twice daily for 4 days, then increase to one 1 mg tablet twice daily.    Dispense:  53 tablet    Refill:  0  .  varenicline (CHANTIX CONTINUING MONTH PAK) 1 MG tablet    Sig: Take 1 tablet (1 mg total) by mouth 2 (two) times daily.    Dispense:  60 tablet    Refill:  1    Return in about 6 months (around 06/27/2020) for HLD/COPD.    This visit occurred during the SARS-CoV-2 public health emergency.  Safety protocols were in place, including screening questions prior to the visit, additional usage of staff PPE, and extensive cleaning of exam room while observing appropriate contact time as indicated for disinfecting solutions.

## 2019-12-28 NOTE — Telephone Encounter (Signed)
Previous records reviewed the following: -Colonoscopy (04/09/2012): Excellent prep, descending colon and sigmoid diverticulosis, 5 polyps in the cecum, ascending colon, descending colon, and sigmoid colon removed with cold snare.  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  Given previous colonoscopy report with multiple TA and SSP's, per guidelines recommend repeat at 3 years.  Currently overdue for this.  Please schedule colonoscopy with me next available for ongoing polyp surveillance.

## 2019-12-29 NOTE — Telephone Encounter (Signed)
LM on vmail to call back to schedule Records at [facility] office

## 2020-01-02 ENCOUNTER — Encounter: Payer: Self-pay | Admitting: Gastroenterology

## 2020-02-03 ENCOUNTER — Ambulatory Visit: Payer: No Typology Code available for payment source | Admitting: Gastroenterology

## 2020-02-03 ENCOUNTER — Other Ambulatory Visit: Payer: Self-pay

## 2020-02-03 VITALS — BP 134/76 | HR 63 | Temp 98.2°F | Ht 70.0 in | Wt 209.0 lb

## 2020-02-03 DIAGNOSIS — K219 Gastro-esophageal reflux disease without esophagitis: Secondary | ICD-10-CM

## 2020-02-03 DIAGNOSIS — Z86010 Personal history of colonic polyps: Secondary | ICD-10-CM

## 2020-02-03 DIAGNOSIS — Z800 Family history of malignant neoplasm of digestive organs: Secondary | ICD-10-CM | POA: Diagnosis not present

## 2020-02-03 DIAGNOSIS — Z8719 Personal history of other diseases of the digestive system: Secondary | ICD-10-CM

## 2020-02-03 DIAGNOSIS — K449 Diaphragmatic hernia without obstruction or gangrene: Secondary | ICD-10-CM

## 2020-02-03 NOTE — Patient Instructions (Addendum)
If you are age 55 or older, your body mass index should be between 23-30. Your Body mass index is 29.99 kg/m. If this is out of the aforementioned range listed, please consider follow up with your Primary Care Provider.  If you are age 29 or younger, your body mass index should be between 19-25. Your Body mass index is 29.99 kg/m. If this is out of the aformentioned range listed, please consider follow up with your Primary Care Provider.   It has been recommended to you by your physician that you have a(n) EGD/Colonoscopy completed. Per your request, we did not schedule the procedure(s) today. Please contact our office at [phone] should you decide to have the procedure completed. You will be scheduled for a pre-visit and procedure at that time.   It was a pleasure to see you today!  Vito Cirigliano, D.O.

## 2020-02-03 NOTE — Progress Notes (Signed)
Chief Complaint: Colon cancer screening/polyp surveillance, nausea, GERD, hiatal hernia  Referring Provider:    Luetta Nutting, DO   HPI:    Cody Flores is a 54 y.o. male  with history of HTN, HLD, COPD, tobacco use disorder, and AAA  referred to the [facility] for evaluation of ongoing colon cancer screening/polyp surveillance.  Colonoscopy completed outside facility in 2013 and notable for 5 polyps (TA and SSP) as outlined below. Had a subsequent partial colectomy in 0000000 for complicated diverticulitis in [place].   Largely without lower GI sxs. Occasional abdominal cramping. No hematochezia, melena, change in bowel habits.   Additionally, has a history of GERD and was told he had a hiatal hernia during EGD 20 years ago in [place]. Reflux sxs for 20+ years with index sxs HB, regurgitation. Well controlled with prevacid 30 mg/day. Breakthrough sxs with 1-2 days of missed doses. No dysphagia.   FHx notable for father with CRC, diagnosed in his late 30's, treated surgically.   Endoscopic history: -Colonoscopy (04/09/2012): Excellent prep, descending colon and sigmoid diverticulosis, 5 polyps in the cecum, ascending colon, descending colon, and sigmoid colon removed with cold snare.  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 - Colonoscopy approx 20 years ago in [place] with 3-4 polyps - EGD approx 20 years ago with [facility]   Past Medical History:  Diagnosis Date  . Abdominal aortic aneurysm (AAA) ([place]) 05/31/2019   3 cm.  Unchanged November 2018  . COPD (chronic obstructive pulmonary disease) ([place])   . Diverticulitis   . GERD (gastroesophageal reflux disease)   . History of colon polyps   . HTN (hypertension) 03/05/2017     Past Surgical History:  Procedure Laterality Date  . COLECTOMY  2013   Hospital in [place]   . COLONOSCOPY  04/09/2012   [facility]   . ESOPHAGOGASTRODUODENOSCOPY     over 20 years ago in  [place]  . LAPAROSCOPIC SIGMOID COLECTOMY    . VASECTOMY     Family History  Problem Relation Age of Onset  . Aneurysm Father   . Colon cancer Father        said tumor was removed   . Diabetes Maternal Grandmother   . Esophageal cancer Neg Hx    Social History   Tobacco Use  . Smoking status: Current Every Day Smoker    Packs/day: 1.50    Types: Cigarettes  . Smokeless tobacco: Never Used  Substance Use Topics  . Alcohol use: Yes    Alcohol/week: 0.0 standard drinks    Comment: ocassionally/rare  . Drug use: No   Current Outpatient Medications  Medication Sig Dispense Refill  . albuterol (PROVENTIL HFA;VENTOLIN HFA) 108 (90 Base) MCG/ACT inhaler Inhale 2 puffs into the lungs every 6 (six) hours as needed for wheezing or shortness of breath. 1 Inhaler 11  . lansoprazole (PREVACID) 30 MG capsule Take 30 mg by mouth daily at 12 noon.    Marland Kitchen lisinopril (ZESTRIL) 20 MG tablet Take 1 tablet (20 mg total) by mouth daily. 90 tablet 3  . rosuvastatin (CRESTOR) 10 MG tablet Take 1 tablet (10 mg total) by mouth daily. 90 tablet 3  . triamcinolone cream (KENALOG) 0.1 % Apply 1 application topically 2 (two) times daily. (Patient taking differently: Apply 1 application topically as needed. ) 453.6 g 1  . varenicline (CHANTIX CONTINUING MONTH PAK) 1 MG tablet Take 1 tablet (1 mg total)  by mouth 2 (two) times daily. 60 tablet 1   No current facility-administered medications for this visit.   No Known Allergies   Review of Systems: All systems reviewed and negative except where noted in HPI.     Physical Exam:    Wt Readings from Last 3 Encounters:  02/03/20 209 lb (94.8 kg)  12/26/19 208 lb (94.3 kg)  05/31/19 201 lb (91.2 kg)    BP 134/76   Pulse 63   Temp 98.2 F (36.8 C)   Ht 5\' 10"  (1.778 m)   Wt 209 lb (94.8 kg)   BMI 29.99 kg/m  Constitutional:  Pleasant, in no acute distress. Psychiatric: Normal mood and affect. Behavior is normal. EENT: Pupils normal.   Conjunctivae are normal. No scleral icterus. Neck supple. No cervical LAD. Cardiovascular: Normal rate, regular rhythm. No edema Pulmonary/chest: Effort normal and breath sounds normal. No wheezing, rales or rhonchi. Abdominal: Well-healed midline incision. Soft, nondistended, nontender. Bowel sounds active throughout. There are no masses palpable. No hepatomegaly. Neurological: Alert and oriented to person place and time. Skin: Skin is warm and dry. No rashes noted.   ASSESSMENT AND PLAN;   1) History of colon polyps 2) History of partial colectomy 3) History of diverticulosis 4) Family history of colon cancer (father, late 39s)  -Plan for repeat colonoscopy for ongoing polyp surveillance -Given prior surgical history, plan for pediatric colonoscope  5) History of GERD 6) Hiatal hernia -Reflux well controlled with lansoprazole and dietary/lifestyle modifications -Given age, duration of reflux, and per current societal guidelines, plan for EGD for Barrett's screening -Can assess size/severity of hiatal hernia at time of EGD  The indications, risks, and benefits of EGD and colonoscopy were explained to the patient in detail. Risks include but are not limited to bleeding, perforation, adverse reaction to medications, and cardiopulmonary compromise. Sequelae include but are not limited to the possibility of surgery, hositalization, and mortality. The patient verbalized understanding and wished to proceed. All questions answered, referred to scheduler and bowel prep ordered. Further recommendations pending results of the exam.    Lavena Bullion, DO, FACG  02/03/2020, 11:40 AM   Luetta Nutting, DO

## 2020-06-25 ENCOUNTER — Other Ambulatory Visit: Payer: Self-pay

## 2020-06-25 ENCOUNTER — Encounter: Payer: Self-pay | Admitting: Family Medicine

## 2020-06-25 ENCOUNTER — Ambulatory Visit (INDEPENDENT_AMBULATORY_CARE_PROVIDER_SITE_OTHER): Payer: No Typology Code available for payment source | Admitting: Family Medicine

## 2020-06-25 VITALS — BP 154/96 | HR 80 | Temp 98.1°F | Wt 208.5 lb

## 2020-06-25 DIAGNOSIS — I714 Abdominal aortic aneurysm, without rupture, unspecified: Secondary | ICD-10-CM

## 2020-06-25 DIAGNOSIS — Z125 Encounter for screening for malignant neoplasm of prostate: Secondary | ICD-10-CM | POA: Diagnosis not present

## 2020-06-25 DIAGNOSIS — E782 Mixed hyperlipidemia: Secondary | ICD-10-CM

## 2020-06-25 DIAGNOSIS — I10 Essential (primary) hypertension: Secondary | ICD-10-CM | POA: Diagnosis not present

## 2020-06-25 DIAGNOSIS — J439 Emphysema, unspecified: Secondary | ICD-10-CM

## 2020-06-25 MED ORDER — ALBUTEROL SULFATE HFA 108 (90 BASE) MCG/ACT IN AERS: 2.0000 | INHALATION_SPRAY | Freq: Four times a day (QID) | RESPIRATORY_TRACT | 11 refills | Status: DC | PRN

## 2020-06-25 MED ORDER — LISINOPRIL-HYDROCHLOROTHIAZIDE 20-25 MG PO TABS: 1.0000 | ORAL_TABLET | Freq: Every day | ORAL | 3 refills | Status: DC

## 2020-06-25 MED FILL — ALBUTEROL SULFATE HFA 108 (: 108 (90 BAS | 25 days supply | Qty: 18 | Fill #0

## 2020-06-25 MED FILL — LISINOPRIL-HCTZ 20-25 MG TA: 20-25 | 90 days supply | Qty: 90 | Fill #0

## 2020-06-25 NOTE — Patient Instructions (Addendum)
Great to see you today! Have labs completed.  Change lisinopril to lisinopril/hctz You will be contacted to set up an ultrasound appointment.  It is important to quit smoking.  Consider trying patches to help wean from cigarettes.   See me again in 6 months.

## 2020-06-25 NOTE — Assessment & Plan Note (Signed)
Counseled on quitting smoking.  Has not had success with chantix or bupropion.  He may consider trying patches.  Albuterol renewed.

## 2020-06-25 NOTE — Assessment & Plan Note (Signed)
Tolerating crestor well, continue at current strength.  Update lipids and cmp

## 2020-06-25 NOTE — Progress Notes (Signed)
Cody Flores - 54 y.o. male MRN [id number]  Date of birth: 11/19/1965  Subjective No chief complaint on file.   HPI Cody Flores is a 54 y.o. male here today for follow up visit.  He has history of HTN, HLD, nicotine dependence, GERD and AAA.   He reports that he is doing well at this time.Cody Flores    HTN is managed with lisinopril 20mg .  He has not had side effects with medication at current dose.  He denies symptoms related to HTN including chest pain, shortness of breath, headache or vision changes. He is still smoking.  He tried chantix but this didn't reduce his cravings.  He did not have success with bupropion either.    He continues to tolerate crestor well for treatment of his HLD.  He denies myalgias.    GERD remains well controlled with prevacid.    He is due for follow up of AAA noted in 2018 on CT abdomen/pelvis.    ROS:  A comprehensive ROS was completed and negative except as noted per HPI  No Known Allergies  Past Medical History:  Diagnosis Date  . Abdominal aortic aneurysm (AAA) ([place]) 05/31/2019   3 cm.  Unchanged November 2018  . COPD (chronic obstructive pulmonary disease) ([place])   . Diverticulitis   . GERD (gastroesophageal reflux disease)   . History of colon polyps   . HTN (hypertension) 03/05/2017    Past Surgical History:  Procedure Laterality Date  . COLECTOMY  2013   Hospital in [place]   . COLONOSCOPY  04/09/2012   [facility]   . ESOPHAGOGASTRODUODENOSCOPY     over 20 years ago in [place]  . LAPAROSCOPIC SIGMOID COLECTOMY    . VASECTOMY      Social History   Socioeconomic History  . Marital status: Married    Spouse name: Not on file  . Number of children: Not on file  . Years of education: Not on file  . Highest education level: Not on file  Occupational History  . Not on file  Tobacco Use  . Smoking status: Current Every Day Smoker    Packs/day: 1.50    Types: Cigarettes  . Smokeless tobacco: Never  Used  Vaping Use  . Vaping Use: Never used  Substance and Sexual Activity  . Alcohol use: Yes    Alcohol/week: 0.0 standard drinks    Comment: ocassionally/rare  . Drug use: No  . Sexual activity: Not on file  Other Topics Concern  . Not on file  Social History Narrative   ** Merged History Encounter **       Social Determinants of Health   Financial Resource Strain:   . Difficulty of Paying Living Expenses: Not on file  Food Insecurity:   . Worried About Charity fundraiser in the Last Year: Not on file  . Ran Out of Food in the Last Year: Not on file  Transportation Needs:   . Lack of Transportation (Medical): Not on file  . Lack of Transportation (Non-Medical): Not on file  Physical Activity:   . Days of Exercise per Week: Not on file  . Minutes of Exercise per Session: Not on file  Stress:   . Feeling of Stress : Not on file  Social Connections:   . Frequency of Communication with Friends and Family: Not on file  . Frequency of Social Gatherings with Friends and Family: Not on file  . Attends Religious Services: Not on file  .  Active Member of Clubs or Organizations: Not on file  . Attends Archivist Meetings: Not on file  . Marital Status: Not on file    Family History  Problem Relation Age of Onset  . Aneurysm Father   . Colon cancer Father        said tumor was removed   . Diabetes Maternal Grandmother   . Esophageal cancer Neg Hx     Health Maintenance  Topic Date Due  . Hepatitis C Screening  Never done  . COVID-19 Vaccine (1) Never done  . HIV Screening  Never done  . COLONOSCOPY  04/14/2015  . INFLUENZA VACCINE  05/27/2020  . TETANUS/TDAP  01/02/2026     ----------------------------------------------------------------------------------------------------------------------------------------------------------------------------------------------------------------- Physical Exam BP (!) 154/96 (BP Location: Left Arm, Patient Position:  Sitting, Cuff Size: Normal)   Pulse 80   Temp 98.1 F (36.7 C)   Wt 208 lb 8 oz (94.6 kg)   SpO2 95%   BMI 29.92 kg/m   Physical Exam Constitutional:      Appearance: Normal appearance.  HENT:     Head: Normocephalic and atraumatic.  Eyes:     General: No scleral icterus. Cardiovascular:     Rate and Rhythm: Normal rate and regular rhythm.  Pulmonary:     Effort: Pulmonary effort is normal.     Breath sounds: Normal breath sounds.  Musculoskeletal:     Cervical back: Neck supple.  Neurological:     General: No focal deficit present.     Mental Status: He is alert.  Psychiatric:        Mood and Affect: Mood normal.        Behavior: Behavior normal.     ------------------------------------------------------------------------------------------------------------------------------------------------------------------------------------------------------------------- Assessment and Plan  Abdominal aortic aneurysm (AAA) ([place]) Repeat US ordered for follow up.  Discussed importance of smoking cessation to prevent worsening.   HTN (hypertension) Blood pressure is not at goal at for age and co-morbidities.  I recommend changing lisinopril to lisinopril/hctz 20/25mg .  Return in 4 weeks for BP check.  In addition they were instructed to follow a low sodium diet with regular exercise to help to maintain adequate control of blood pressure.    COPD (chronic obstructive pulmonary disease) ([place]) Counseled on quitting smoking.  Has not had success with chantix or bupropion.  He may consider trying patches.  Albuterol renewed.   HLD (hyperlipidemia) Tolerating crestor well, continue at current strength.  Update lipids and cmp    Meds ordered this encounter  Medications  . albuterol (VENTOLIN HFA) 108 (90 Base) MCG/ACT inhaler    Sig: Inhale 2 puffs into the lungs every 6 (six) hours as needed for wheezing or shortness of breath.    Dispense:  8 g    Refill:  11  .  lisinopril-hydrochlorothiazide (ZESTORETIC) 20-25 MG tablet    Sig: Take 1 tablet by mouth daily.    Dispense:  90 tablet    Refill:  3    Return in about 6 months (around 12/24/2020) for HTN.    This visit occurred during the SARS-CoV-2 public health emergency.  Safety protocols were in place, including screening questions prior to the visit, additional usage of staff PPE, and extensive cleaning of exam room while observing appropriate contact time as indicated for disinfecting solutions.

## 2020-06-25 NOTE — Assessment & Plan Note (Signed)
Blood pressure is not at goal at for age and co-morbidities.  I recommend changing lisinopril to lisinopril/hctz 20/25mg .  Return in 4 weeks for BP check.  In addition they were instructed to follow a low sodium diet with regular exercise to help to maintain adequate control of blood pressure.

## 2020-06-25 NOTE — Assessment & Plan Note (Signed)
Repeat US ordered for follow up.  Discussed importance of smoking cessation to prevent worsening.

## 2020-06-26 LAB — COMPLETE METABOLIC PANEL WITH GFR
AG Ratio: 1.7 (calc) (ref 1.0–2.5)
ALT: 14 U/L (ref 9–46)
AST: 15 U/L (ref 10–35)
Albumin: 4.3 g/dL (ref 3.6–5.1)
Alkaline phosphatase (APISO): 64 U/L (ref 35–144)
BUN: 20 mg/dL (ref 7–25)
CO2: 29 mmol/L (ref 20–32)
Calcium: 9.5 mg/dL (ref 8.6–10.3)
Chloride: 103 mmol/L (ref 98–110)
Creat: 0.89 mg/dL (ref 0.70–1.33)
GFR, Est African American: 112 mL/min/{1.73_m2} (ref >=60)
GFR, Est Non African American: 96 mL/min/{1.73_m2} (ref >=60)
Globulin: 2.5 g/dL (calc) (ref 1.9–3.7)
Glucose, Bld: 103 mg/dL — ABNORMAL HIGH (ref 65–99)
Potassium: 4.6 mmol/L (ref 3.5–5.3)
Sodium: 138 mmol/L (ref 135–146)
Total Bilirubin: 0.4 mg/dL (ref 0.2–1.2)
Total Protein: 6.8 g/dL (ref 6.1–8.1)

## 2020-06-26 LAB — CBC
HCT: 42 % (ref 38.5–50.0)
Hemoglobin: 14 g/dL (ref 13.2–17.1)
MCH: 28.6 pg (ref 27.0–33.0)
MCHC: 33.3 g/dL (ref 32.0–36.0)
MCV: 85.7 fL (ref 80.0–100.0)
MPV: 10.4 fL (ref 7.5–12.5)
Platelets: 236 10*3/uL (ref 140–400)
RBC: 4.9 10*6/uL (ref 4.20–5.80)
RDW: 13.8 % (ref 11.0–15.0)
WBC: 6.7 10*3/uL (ref 3.8–10.8)

## 2020-06-26 LAB — PSA: PSA: 0.6 ng/mL (ref <=4.0)

## 2020-06-26 LAB — LIPID PANEL
Cholesterol: 149 mg/dL (ref <200)
HDL: 38 mg/dL — ABNORMAL LOW (ref >=40)
LDL Cholesterol (Calc): 95 mg/dL (calc)
Non-HDL Cholesterol (Calc): 111 mg/dL (calc) (ref <130)
Total CHOL/HDL Ratio: 3.9 (calc) (ref <5.0)
Triglycerides: 71 mg/dL (ref <150)

## 2020-07-09 ENCOUNTER — Telehealth: Payer: Self-pay | Admitting: Family Medicine

## 2020-07-09 ENCOUNTER — Other Ambulatory Visit: Payer: Self-pay | Admitting: Family Medicine

## 2020-07-09 MED ORDER — LISINOPRIL 40 MG PO TABS
40.0000 mg | ORAL_TABLET | Freq: Every day | ORAL | 1 refills | Status: DC
Start: 2020-07-09 — End: 2020-12-24

## 2020-07-09 MED FILL — LISINOPRIL 40 MG TABS: 40 | 90 days supply | Qty: 90 | Fill #0

## 2020-07-09 NOTE — Telephone Encounter (Signed)
PT has been experiencing stomach pain on new blood pressure medication. Would like to go back to old medication. Please advise.

## 2020-07-09 NOTE — Telephone Encounter (Signed)
Lisinopril sent back in at increased dose of 40mg  as BP were not well controlled at last appt.

## 2020-07-10 NOTE — Telephone Encounter (Signed)
Pt has been advised of medication change. Ready for pick-up at [facility].

## 2020-07-23 ENCOUNTER — Ambulatory Visit: Payer: No Typology Code available for payment source

## 2020-07-30 ENCOUNTER — Ambulatory Visit: Payer: No Typology Code available for payment source

## 2020-07-30 ENCOUNTER — Ambulatory Visit (INDEPENDENT_AMBULATORY_CARE_PROVIDER_SITE_OTHER): Payer: No Typology Code available for payment source | Admitting: Family Medicine

## 2020-07-30 ENCOUNTER — Other Ambulatory Visit: Payer: Self-pay | Admitting: Family Medicine

## 2020-07-30 ENCOUNTER — Ambulatory Visit (INDEPENDENT_AMBULATORY_CARE_PROVIDER_SITE_OTHER): Payer: No Typology Code available for payment source

## 2020-07-30 ENCOUNTER — Other Ambulatory Visit: Payer: Self-pay

## 2020-07-30 VITALS — BP 148/87 | HR 63 | Wt 208.0 lb

## 2020-07-30 DIAGNOSIS — I714 Abdominal aortic aneurysm, without rupture, unspecified: Secondary | ICD-10-CM

## 2020-07-30 DIAGNOSIS — I10 Essential (primary) hypertension: Secondary | ICD-10-CM | POA: Diagnosis not present

## 2020-07-30 NOTE — Progress Notes (Signed)
Established Patient Office Visit  Subjective:  Patient ID: Cody Flores, male    DOB: Jan 09, 1966  Age: 54 y.o. MRN: [id number]  CC:  Chief Complaint  Patient presents with   Hypertension    HPI Cody Flores presents for blood pressure check. Denies chest pain, shortness of breath or dizziness.   Past Medical History:  Diagnosis Date   Abdominal aortic aneurysm (AAA) ([place]) 05/31/2019   3 cm.  Unchanged November 2018   COPD (chronic obstructive pulmonary disease) ([facility])    Diverticulitis    GERD (gastroesophageal reflux disease)    History of colon polyps    HTN (hypertension) 03/05/2017    Past Surgical History:  Procedure Laterality Date   COLECTOMY  2013   Hospital in [place]    COLONOSCOPY  04/09/2012   [facility]    ESOPHAGOGASTRODUODENOSCOPY     over 20 years ago in [place]      Family History  Problem Relation Age of Onset   Aneurysm Father    Colon cancer Father        said tumor was removed    Diabetes Maternal Grandmother    Esophageal cancer Neg Hx     Social History   Socioeconomic History   Marital status: Married    Spouse name: Not on file   Number of children: Not on file   Years of education: Not on file   Highest education level: Not on file  Occupational History   Not on file  Tobacco Use   Smoking status: Current Every Day Smoker    Packs/day: 1.50    Types: Cigarettes   Smokeless tobacco: Never Used  Vaping Use   Vaping Use: Never used  Substance and Sexual Activity   Alcohol use: Yes    Alcohol/week: 0.0 standard drinks    Comment: ocassionally/rare   Drug use: No   Sexual activity: Not on file  Other Topics Concern   Not on file  Social History Narrative   ** Merged History Encounter **       Social Determinants of Health   Financial Resource Strain:    Difficulty of Paying Living Expenses: Not on file   Food Insecurity:    Worried About Charity fundraiser in the Last Year: Not on file   [employer] of Food in the Last Year: Not on file  Transportation Needs:    Lack of Transportation (Medical): Not on file   Lack of Transportation (Non-Medical): Not on file  Physical Activity:    Days of Exercise per Week: Not on file   Minutes of Exercise per Session: Not on file  Stress:    Feeling of Stress : Not on file  Social Connections:    Frequency of Communication with Friends and Family: Not on file   Frequency of Social Gatherings with Friends and Family: Not on file   Attends Religious Services: Not on file   Active Member of Clubs or Organizations: Not on file   Attends Archivist Meetings: Not on file   Marital Status: Not on file  Intimate Partner Violence:    Fear of Current or Ex-Partner: Not on file   Emotionally Abused: Not on file   Physically Abused: Not on file   Sexually Abused: Not on file    Outpatient Medications Prior to Visit  Medication Sig Dispense Refill   albuterol (VENTOLIN HFA) 108 (90 Base)  MCG/ACT inhaler Inhale 2 puffs into the lungs every 6 (six) hours as needed for wheezing or shortness of breath. 8 g 11   lansoprazole (PREVACID) 30 MG capsule Take 30 mg by mouth daily at 12 noon.     lisinopril (ZESTRIL) 40 MG tablet Take 1 tablet (40 mg total) by mouth daily. 90 tablet 1   rosuvastatin (CRESTOR) 10 MG tablet Take 1 tablet (10 mg total) by mouth daily. 90 tablet 3   triamcinolone cream (KENALOG) 0.1 % Apply 1 application topically 2 (two) times daily. (Patient taking differently: Apply 1 application topically as needed. ) 453.6 g 1   No facility-administered medications prior to visit.    No Known Allergies  ROS Review of Systems    Objective:    Physical Exam  BP (!) 165/88    Pulse 63    Wt 208 lb (94.3 kg)    SpO2 99%    BMI 29.84 kg/m  Wt Readings from Last 3 Encounters:  07/30/20 208 lb (94.3 kg)  06/25/20  208 lb 8 oz (94.6 kg)  02/03/20 209 lb (94.8 kg)     Health Maintenance Due  Topic Date Due   Hepatitis C Screening  Never done   HIV Screening  Never done   COLONOSCOPY  04/14/2015   INFLUENZA VACCINE  Never done    There are no preventive care reminders to display for this patient.  Lab Results  Component Value Date   TSH 1.45 01/03/2016   Lab Results  Component Value Date   WBC 6.7 06/25/2020   HGB 14.0 06/25/2020   HCT 42.0 06/25/2020   MCV 85.7 06/25/2020   PLT 236 06/25/2020   Lab Results  Component Value Date   NA 138 06/25/2020   K 4.6 06/25/2020   CO2 29 06/25/2020   GLUCOSE 103 (H) 06/25/2020   BUN 20 06/25/2020   CREATININE 0.89 06/25/2020   BILITOT 0.4 06/25/2020   ALKPHOS 75 04/30/2017   AST 15 06/25/2020   ALT 14 06/25/2020   PROT 6.8 06/25/2020   ALBUMIN 4.1 04/30/2017   CALCIUM 9.5 06/25/2020   Lab Results  Component Value Date   CHOL 149 06/25/2020   Lab Results  Component Value Date   HDL 38 (L) 06/25/2020   Lab Results  Component Value Date   LDLCALC 95 06/25/2020   Lab Results  Component Value Date   TRIG 71 06/25/2020   Lab Results  Component Value Date   CHOLHDL 3.9 06/25/2020   Lab Results  Component Value Date   HGBA1C 5.9 (H) 05/31/2019      Assessment & Plan:  Hypertension - Blood pressure just above goal. Patient advised to continue medication and follow up in 6 weeks with Dr Zigmund Daniel.    Problem List Items Addressed This Visit    None    Visit Diagnoses    Essential hypertension    -  Primary      No orders of the defined types were placed in this encounter.   Follow-up: Return in about 6 weeks (around 09/10/2020) for with Dr Zigmund Daniel for Hypertension. Durene Romans, Monico Blitz, [place]

## 2020-07-30 NOTE — Progress Notes (Signed)
Medical screening examination/treatment was performed by qualified clinical staff member and as supervising physician I was immediately available for consultation/collaboration. I have reviewed documentation and agree with assessment and plan.  Airyana Sprunger, DO  

## 2020-09-10 ENCOUNTER — Encounter: Payer: Self-pay | Admitting: Family Medicine

## 2020-09-10 ENCOUNTER — Ambulatory Visit (INDEPENDENT_AMBULATORY_CARE_PROVIDER_SITE_OTHER): Payer: No Typology Code available for payment source | Admitting: Family Medicine

## 2020-09-10 DIAGNOSIS — I10 Essential (primary) hypertension: Secondary | ICD-10-CM | POA: Diagnosis not present

## 2020-09-10 NOTE — Progress Notes (Signed)
Cody Flores - 54 y.o. male MRN [id number]  Date of birth: [birth year]  Subjective Chief Complaint  Patient presents with  . Hypertension    HPI  Cody Flores is a 54 y.o. male here today for follow up of HTN.  His lisinopril was increased at last appointment.  His BP today is high initially. He denies symptoms related to htn including chest pain, shortness of breath, palpitations, headache or vision changes.  He does have a BP cuff at home but has not been checking readings.    ROS:  A comprehensive ROS was completed and negative except as noted per HPI  No Known Allergies  Past Medical History:  Diagnosis Date  . Abdominal aortic aneurysm (AAA) ([place]) 05/31/2019   3 cm.  Unchanged November 2018  . COPD (chronic obstructive pulmonary disease) ([place])   . Diverticulitis   . GERD (gastroesophageal reflux disease)   . History of colon polyps   . HTN (hypertension) 03/05/2017    Past Surgical History:  Procedure Laterality Date  . COLECTOMY  2013   Hospital in [place]   . COLONOSCOPY  04/09/2012   [facility] health specialist   . ESOPHAGOGASTRODUODENOSCOPY     over 20 years ago in [place]  . LAPAROSCOPIC SIGMOID COLECTOMY    . VASECTOMY      Social History   Socioeconomic History  . Marital status: Married    Spouse name: Not on file  . Number of children: Not on file  . Years of education: Not on file  . Highest education level: Not on file  Occupational History  . Not on file  Tobacco Use  . Smoking status: Current Every Day Smoker    Packs/day: 1.50    Types: Cigarettes  . Smokeless tobacco: Never Used  Vaping Use  . Vaping Use: Never used  Substance and Sexual Activity  . Alcohol use: Yes    Alcohol/week: 0.0 standard drinks    Comment: ocassionally/rare  . Drug use: No  . Sexual activity: Not on file  Other Topics Concern  . Not on file  Social History Narrative   ** Merged History Encounter **       Social Determinants of Health    Financial Resource Strain:   . Difficulty of Paying Living Expenses: Not on file  Food Insecurity:   . Worried About Charity fundraiser in the Last Year: Not on file  . Ran Out of Food in the Last Year: Not on file  Transportation Needs:   . Lack of Transportation (Medical): Not on file  . Lack of Transportation (Non-Medical): Not on file  Physical Activity:   . Days of Exercise per Week: Not on file  . Minutes of Exercise per Session: Not on file  Stress:   . Feeling of Stress : Not on file  Social Connections:   . Frequency of Communication with Friends and Family: Not on file  . Frequency of Social Gatherings with Friends and Family: Not on file  . Attends Religious Services: Not on file  . Active Member of Clubs or Organizations: Not on file  . Attends Archivist Meetings: Not on file  . Marital Status: Not on file    Family History  Problem Relation Age of Onset  . Aneurysm Father   . Colon cancer Father        said tumor was removed   . Diabetes Maternal Grandmother   . Esophageal cancer Neg Hx  Health Maintenance  Topic Date Due  . Hepatitis C Screening  Never done  . HIV Screening  Never done  . COLONOSCOPY  04/14/2015  . INFLUENZA VACCINE  Never done  . TETANUS/TDAP  01/02/2026  . COVID-19 Vaccine  Completed     ----------------------------------------------------------------------------------------------------------------------------------------------------------------------------------------------------------------- Physical Exam BP (!) 144/84 (BP Location: Left Arm, Patient Position: Sitting, Cuff Size: Normal)   Pulse 61   Wt 212 lb 9.6 oz (96.4 kg)   SpO2 98%   BMI 30.50 kg/m   Physical Exam Constitutional:      Appearance: Normal appearance.  Eyes:     General: No scleral icterus. Cardiovascular:     Rate and Rhythm: Normal rate and regular rhythm.  Pulmonary:     Effort: Pulmonary effort is normal.     Breath sounds: Normal  breath sounds.  Musculoskeletal:     Cervical back: Neck supple.  Neurological:     General: No focal deficit present.     Mental Status: He is alert.  Psychiatric:        Mood and Affect: Mood normal.        Behavior: Behavior normal.     ------------------------------------------------------------------------------------------------------------------------------------------------------------------------------------------------------------------- Assessment and Plan  HTN (hypertension) Control of HTN has improved.  Counseled on smoking cessation.  He will check BP readings at home as well and send me log after 1 month.  He will keep appt for f/u in 11/2020   No orders of the defined types were placed in this encounter.   No follow-ups on file.    This visit occurred during the SARS-CoV-2 public health emergency.  Safety protocols were in place, including screening questions prior to the visit, additional usage of staff PPE, and extensive cleaning of exam room while observing appropriate contact time as indicated for disinfecting solutions.

## 2020-09-10 NOTE — Assessment & Plan Note (Signed)
Control of HTN has improved.  Counseled on smoking cessation.  He will check BP readings at home as well and send me log after 1 month.  He will keep appt for f/u in 11/2020

## 2020-09-10 NOTE — Patient Instructions (Signed)
Please monitor blood pressure at home 3-4 times per week should be sufficient.  Send me readings through mychart after about 1 month.

## 2020-10-22 MED FILL — LISINOPRIL 40 MG TABS: 40 | 90 days supply | Qty: 90 | Fill #1

## 2020-10-22 MED FILL — ROSUVASTATIN CALCIUM 10 MG: 10 | 90 days supply | Qty: 90 | Fill #2

## 2020-12-06 DIAGNOSIS — H5203 Hypermetropia, bilateral: Secondary | ICD-10-CM | POA: Diagnosis not present

## 2020-12-06 DIAGNOSIS — H47392 Other disorders of optic disc, left eye: Secondary | ICD-10-CM | POA: Diagnosis not present

## 2020-12-06 LAB — HM DIABETES EYE EXAM

## 2020-12-24 ENCOUNTER — Ambulatory Visit (INDEPENDENT_AMBULATORY_CARE_PROVIDER_SITE_OTHER): Payer: No Typology Code available for payment source | Admitting: Family Medicine

## 2020-12-24 ENCOUNTER — Encounter: Payer: Self-pay | Admitting: Family Medicine

## 2020-12-24 ENCOUNTER — Other Ambulatory Visit: Payer: Self-pay

## 2020-12-24 ENCOUNTER — Other Ambulatory Visit: Payer: Self-pay | Admitting: Family Medicine

## 2020-12-24 VITALS — BP 140/70 | HR 79 | Wt 212.0 lb

## 2020-12-24 DIAGNOSIS — Z1211 Encounter for screening for malignant neoplasm of colon: Secondary | ICD-10-CM

## 2020-12-24 DIAGNOSIS — E782 Mixed hyperlipidemia: Secondary | ICD-10-CM | POA: Diagnosis not present

## 2020-12-24 DIAGNOSIS — I714 Abdominal aortic aneurysm, without rupture, unspecified: Secondary | ICD-10-CM

## 2020-12-24 DIAGNOSIS — Z9049 Acquired absence of other specified parts of digestive tract: Secondary | ICD-10-CM

## 2020-12-24 DIAGNOSIS — F17210 Nicotine dependence, cigarettes, uncomplicated: Secondary | ICD-10-CM

## 2020-12-24 DIAGNOSIS — I10 Essential (primary) hypertension: Secondary | ICD-10-CM | POA: Diagnosis not present

## 2020-12-24 DIAGNOSIS — M545 Low back pain, unspecified: Secondary | ICD-10-CM

## 2020-12-24 MED ORDER — LISINOPRIL-HYDROCHLOROTHIAZIDE 20-25 MG PO TABS
1.0000 | ORAL_TABLET | Freq: Every day | ORAL | 3 refills | Status: DC
Start: 2020-12-24 — End: 2020-12-24

## 2020-12-24 MED ORDER — ROSUVASTATIN CALCIUM 10 MG PO TABS
10.0000 mg | ORAL_TABLET | Freq: Every day | ORAL | 3 refills | Status: DC
Start: 2020-12-24 — End: 2020-12-24

## 2020-12-24 MED FILL — LISINOPRIL-HCTZ 20-25 MG TA: 20-25 | 90 days supply | Qty: 90 | Fill #0

## 2020-12-24 NOTE — Assessment & Plan Note (Signed)
Continue rosuvastatin at current strength.  Repeat labs at next visit.

## 2020-12-24 NOTE — Patient Instructions (Signed)
Great to see you! Continue current medications Try increasing fiber intake. I placed a referral for colonoscopy.  Try back rehab exercise.  See me again in 6 months.

## 2020-12-24 NOTE — Assessment & Plan Note (Signed)
BP remains fairly well controlled.   Recommend continuation of lisinopril/hctz at current strength.  Follow low sodium diet.  Discussed smoking cessation.

## 2020-12-24 NOTE — Assessment & Plan Note (Signed)
Given handout for back rehab exercises.

## 2020-12-24 NOTE — Progress Notes (Signed)
Cody Flores - 55 y.o. male MRN [id number]  Date of birth: July 10, 1966  Subjective No chief complaint on file.   HPI Cody Flores is a 55 y.o. male with history of HTN, HLD, AAA here today for follow up visit.   HTN is currently managed with lisinopril/hctz 20/25mg  daily.  He is doing well with medication.   He denies symptoms related to HTN including chest pain, shortness of breath, palpitations, headache or vision changes.    HLD is treated with crestor 10mg  daily.  He is tolerating this well without myalgias.  Lab Results  Component Value Date   [place] 06/25/2020    He had abdominal US in 10/21 that showed stable AAA with recommendation to f/u in 2 years.  Denies abdominal pain.  Unfortunately he continues to smoke, no interest in quitting at this time.   He is having some low back pain that comes and goes.  Non-radiating.  Located on L lower side.   Also having some intermittent L sided abdominal pain.  Worse if constipated.  He denies nausea, blood in stool/dark stools.  He is due for updated colonoscopy.   ROS:  A comprehensive ROS was completed and negative except as noted per HPI  No Known Allergies  Past Medical History:  Diagnosis Date  . Abdominal aortic aneurysm (AAA) ([place]) 05/31/2019   3 cm.  Unchanged November 2018  . COPD (chronic obstructive pulmonary disease) ([place])   . Diverticulitis   . GERD (gastroesophageal reflux disease)   . History of colon polyps   . HTN (hypertension) 03/05/2017    Past Surgical History:  Procedure Laterality Date  . COLECTOMY  2013   Hospital in [place]   . COLONOSCOPY  04/09/2012   [facility]   . ESOPHAGOGASTRODUODENOSCOPY     over 20 years ago in [place]  . LAPAROSCOPIC SIGMOID COLECTOMY    . VASECTOMY      Social History   Socioeconomic History  . Marital status: Married    Spouse name: Not on file  . Number of children: Not on file  . Years of education: Not on file  .  Highest education level: Not on file  Occupational History  . Not on file  Tobacco Use  . Smoking status: Current Every Day Smoker    Packs/day: 1.50    Types: Cigarettes  . Smokeless tobacco: Never Used  Vaping Use  . Vaping Use: Never used  Substance and Sexual Activity  . Alcohol use: Yes    Alcohol/week: 0.0 standard drinks    Comment: ocassionally/rare  . Drug use: No  . Sexual activity: Not on file  Other Topics Concern  . Not on file  Social History Narrative   ** Merged History Encounter **       Social Determinants of Health   Financial Resource Strain: Not on file  Food Insecurity: Not on file  Transportation Needs: Not on file  Physical Activity: Not on file  Stress: Not on file  Social Connections: Not on file    Family History  Problem Relation Age of Onset  . Aneurysm Father   . Colon cancer Father        said tumor was removed   . Diabetes Maternal Grandmother   . Esophageal cancer Neg Hx     Health Maintenance  Topic Date Due  . Hepatitis C Screening  Never done  . HIV Screening  Never done  . COLONOSCOPY (Pts 45-3yrs Insurance coverage will  need to be confirmed)  04/14/2015  . COVID-19 Vaccine (3 - Booster for Moderna series) 12/21/2020  . INFLUENZA VACCINE  01/24/2021 (Originally 05/27/2020)  . TETANUS/TDAP  01/02/2026     ----------------------------------------------------------------------------------------------------------------------------------------------------------------------------------------------------------------- Physical Exam BP 140/70 (BP Location: Left Arm, Patient Position: Sitting, Cuff Size: Large)   Pulse 79   Wt 212 lb (96.2 kg)   SpO2 97%   BMI 30.42 kg/m   Physical Exam Constitutional:      Appearance: Normal appearance.  HENT:     Head: Normocephalic and atraumatic.  Eyes:     General: No scleral icterus. Cardiovascular:     Rate and Rhythm: Normal rate and regular rhythm.  Pulmonary:     Effort:  Pulmonary effort is normal.     Breath sounds: Normal breath sounds.  Neurological:     General: No focal deficit present.     Mental Status: He is alert.  Psychiatric:        Mood and Affect: Mood normal.        Behavior: Behavior normal.     ------------------------------------------------------------------------------------------------------------------------------------------------------------------------------------------------------------------- Assessment and Plan  HTN (hypertension) BP remains fairly well controlled.   Recommend continuation of lisinopril/hctz at current strength.  Follow low sodium diet.  Discussed smoking cessation.   Abdominal aortic aneurysm (AAA) ([place]) Stable per last [place].  Repeat in 10/23.  Smoking cessation discussed.   HLD (hyperlipidemia) Continue rosuvastatin at current strength.  Repeat labs at next visit.   History of partial colectomy He is having some L sided discomfort that is intermittent.  Overdue for colon cancer screening, referral entered.  Recommend addition of fiber supplement.   Low back pain Given handout for back rehab exercises.   Meds ordered this encounter  Medications  . lisinopril-hydrochlorothiazide (ZESTORETIC) 20-25 MG tablet    Sig: Take 1 tablet by mouth daily.    Dispense:  90 tablet    Refill:  3  . rosuvastatin (CRESTOR) 10 MG tablet    Sig: Take 1 tablet (10 mg total) by mouth daily.    Dispense:  90 tablet    Refill:  3    Replaces pravastatin    No follow-ups on file.    This visit occurred during the SARS-CoV-2 public health emergency.  Safety protocols were in place, including screening questions prior to the visit, additional usage of staff PPE, and extensive cleaning of exam room while observing appropriate contact time as indicated for disinfecting solutions.

## 2020-12-24 NOTE — Assessment & Plan Note (Signed)
Stable per last [place].  Repeat in 10/23.  Smoking cessation discussed.

## 2020-12-24 NOTE — Assessment & Plan Note (Signed)
He is having some L sided discomfort that is intermittent.  Overdue for colon cancer screening, referral entered.  Recommend addition of fiber supplement.

## 2021-01-18 ENCOUNTER — Other Ambulatory Visit (HOSPITAL_BASED_OUTPATIENT_CLINIC_OR_DEPARTMENT_OTHER): Payer: Self-pay

## 2021-04-11 ENCOUNTER — Other Ambulatory Visit (HOSPITAL_COMMUNITY): Payer: Self-pay

## 2021-04-11 MED FILL — Rosuvastatin Calcium Tab 10 MG: ORAL | 90 days supply | Qty: 90 | Fill #0 | Status: AC

## 2021-04-11 MED FILL — Lisinopril & Hydrochlorothiazide Tab 20-25 MG: ORAL | 90 days supply | Qty: 90 | Fill #0 | Status: AC

## 2021-04-12 ENCOUNTER — Other Ambulatory Visit (HOSPITAL_COMMUNITY): Payer: Self-pay

## 2021-04-22 ENCOUNTER — Other Ambulatory Visit (HOSPITAL_COMMUNITY): Payer: Self-pay

## 2021-04-22 ENCOUNTER — Ambulatory Visit (AMBULATORY_SURGERY_CENTER): Payer: No Typology Code available for payment source

## 2021-04-22 ENCOUNTER — Other Ambulatory Visit: Payer: Self-pay

## 2021-04-22 VITALS — Ht 70.0 in | Wt 200.0 lb

## 2021-04-22 DIAGNOSIS — K449 Diaphragmatic hernia without obstruction or gangrene: Secondary | ICD-10-CM

## 2021-04-22 DIAGNOSIS — K219 Gastro-esophageal reflux disease without esophagitis: Secondary | ICD-10-CM

## 2021-04-22 DIAGNOSIS — Z86010 Personal history of colonic polyps: Secondary | ICD-10-CM

## 2021-04-22 MED ORDER — NA SULFATE-K SULFATE-MG SULF 17.5-3.13-1.6 GM/177ML PO SOLN
1.0000 | Freq: Once | ORAL | 0 refills | Status: AC
  Filled 2021-04-22: qty 354, 1d supply, fill #0

## 2021-04-22 NOTE — Progress Notes (Signed)
Patient's pre-visit was done today over the phone with the patient due to COVID-19 pandemic.   Name,DOB and address verified. Insurance verified. Patient denies any allergies to Eggs and Soy. Patient denies any problems with anesthesia/sedation. Patient denies taking diet pills or blood thinners. Packet of Prep instructions mailed to patient including a copy of a consent form-pt is aware. Patient understands to call us back with any questions or concerns. Patient is aware of our care-partner policy and [id number] safety protocol.   The patient is COVID-19 vaccinated, per patient.

## 2021-05-06 ENCOUNTER — Encounter: Payer: Self-pay | Admitting: Gastroenterology

## 2021-05-06 ENCOUNTER — Other Ambulatory Visit: Payer: Self-pay

## 2021-05-06 ENCOUNTER — Ambulatory Visit (AMBULATORY_SURGERY_CENTER): Payer: No Typology Code available for payment source | Admitting: Gastroenterology

## 2021-05-06 VITALS — BP 107/65 | HR 49 | Temp 97.5°F | Resp 14 | Ht 70.0 in | Wt 200.0 lb

## 2021-05-06 DIAGNOSIS — Z86010 Personal history of colonic polyps: Secondary | ICD-10-CM | POA: Diagnosis not present

## 2021-05-06 DIAGNOSIS — K2970 Gastritis, unspecified, without bleeding: Secondary | ICD-10-CM | POA: Diagnosis not present

## 2021-05-06 DIAGNOSIS — K449 Diaphragmatic hernia without obstruction or gangrene: Secondary | ICD-10-CM | POA: Diagnosis not present

## 2021-05-06 DIAGNOSIS — K2980 Duodenitis without bleeding: Secondary | ICD-10-CM

## 2021-05-06 DIAGNOSIS — D125 Benign neoplasm of sigmoid colon: Secondary | ICD-10-CM | POA: Diagnosis not present

## 2021-05-06 DIAGNOSIS — D122 Benign neoplasm of ascending colon: Secondary | ICD-10-CM

## 2021-05-06 DIAGNOSIS — D124 Benign neoplasm of descending colon: Secondary | ICD-10-CM | POA: Diagnosis not present

## 2021-05-06 DIAGNOSIS — K319 Disease of stomach and duodenum, unspecified: Secondary | ICD-10-CM

## 2021-05-06 DIAGNOSIS — K219 Gastro-esophageal reflux disease without esophagitis: Secondary | ICD-10-CM | POA: Diagnosis not present

## 2021-05-06 DIAGNOSIS — D120 Benign neoplasm of cecum: Secondary | ICD-10-CM

## 2021-05-06 DIAGNOSIS — K2990 Gastroduodenitis, unspecified, without bleeding: Secondary | ICD-10-CM | POA: Diagnosis not present

## 2021-05-06 MED ORDER — SODIUM CHLORIDE 0.9 % IV SOLN
500.0000 mL | INTRAVENOUS | Status: DC
Start: 2021-05-06 — End: 2021-05-06

## 2021-05-06 NOTE — Op Note (Signed)
[place] Patient Name: Cody Flores Procedure Date: 05/06/2021 7:27 AM MRN: [id number] Endoscopist: Gerrit Heck , MD Age: 55 Referring MD:  Date of Birth: 1966/07/09 Gender: Male Account #: [id number] Procedure:                Upper GI endoscopy Indications:              Suspected esophageal reflux, Exclusion of Barrett's                            esophagus                           55 yo with long standing history of GERD, presents                            for Barrett's Esophagus screening. Medicines:                Monitored Anesthesia Care Procedure:                Pre-Anesthesia Assessment:                           - Prior to the procedure, a History and Physical                            was performed, and patient medications and                            allergies were reviewed. The patient's tolerance of                            previous anesthesia was also reviewed. The risks                            and benefits of the procedure and the sedation                            options and risks were discussed with the patient.                            All questions were answered, and informed consent                            was obtained. Prior Anticoagulants: The patient has                            taken no previous anticoagulant or antiplatelet                            agents. ASA Grade Assessment: III - A patient with                            severe systemic disease. After reviewing the risks  and benefits, the patient was deemed in                            satisfactory condition to undergo the procedure.                           After obtaining informed consent, the endoscope was                            passed under direct vision. Throughout the                            procedure, the patient's blood pressure, pulse, and                            oxygen saturations were monitored continuously. The                             GIF [id number] #[id number] was introduced through the                            mouth, and advanced to the second part of duodenum.                            The upper GI endoscopy was accomplished without                            difficulty. The patient tolerated the procedure                            well. Scope In: Scope Out: Findings:                 The Z-line was mildly irregular, with a <1 cm area                            of salmon colored mucosa located 36 cm from the                            incisors. Biopsies were taken with a cold forceps                            for histology. Estimated blood loss was minimal.                           A 4 cm hiatal hernia was present.                           Scattered minimal inflammation characterized by                            erythema was found in the gastric body and in the  gastric antrum. Biopsies were taken with a cold                            forceps for Helicobacter pylori testing. Estimated                            blood loss was minimal.                           Localized mildly erythematous mucosa without active                            bleeding was found in the duodenal bulb. Biopsies                            were taken with a cold forceps for histology.                            Estimated blood loss was minimal.                           The second portion of the duodenum was normal. Complications:            No immediate complications. Estimated Blood Loss:     Estimated blood loss was minimal. Impression:               - Z-line irregular, 36 cm from the incisors.                            Biopsied.                           - 4 cm hiatal hernia.                           - Gastritis. Biopsied.                           - Erythematous duodenopathy. Biopsied.                           - Normal second portion of the duodenum. Recommendation:           -  Patient has a contact number available for                            emergencies. The signs and symptoms of potential                            delayed complications were discussed with the                            patient. Return to normal activities tomorrow.                            Written discharge instructions were provided to the  patient.                           - Resume previous diet.                           - Continue present medications.                           - Await pathology results. Gerrit Heck, MD 05/06/2021 8:45:08 AM

## 2021-05-06 NOTE — Patient Instructions (Signed)
HANDOUTS PROVIDED ON: HIATAL HERNIA, GASTRITIS, POLYPS, & DIVERTICULOSIS  The polyps removed/biopsies taken today have been sent for pathology.  The results can take 1-3 weeks to receive.  When your next colonoscopy should occur will be based on the pathology results.    You may resume your previous diet and medication schedule.  Thank you for allowing [place] to care for you today!!!   YOU HAD AN ENDOSCOPIC PROCEDURE TODAY AT [place]:   Refer to the procedure report that was given to you for any specific questions about what was found during the examination.  If the procedure report does not answer your questions, please call your gastroenterologist to clarify.  If you requested that your care partner not be given the details of your procedure findings, then the procedure report has been included in a sealed envelope for you to review at your convenience later.  YOU SHOULD EXPECT: Some feelings of bloating in the abdomen. Passage of more gas than usual.  Walking can help get rid of the air that was put into your GI tract during the procedure and reduce the bloating. If you had a lower endoscopy (such as a colonoscopy or flexible sigmoidoscopy) you may notice spotting of blood in your stool or on the toilet paper. If you underwent a bowel prep for your procedure, you may not have a normal bowel movement for a few days.  Please Note:  You might notice some irritation and congestion in your nose or some drainage.  This is from the oxygen used during your procedure.  There is no need for concern and it should clear up in a day or so.  SYMPTOMS TO REPORT IMMEDIATELY:  Following lower endoscopy (colonoscopy or flexible sigmoidoscopy):  Excessive amounts of blood in the stool  Significant tenderness or worsening of abdominal pains  Swelling of the abdomen that is new, acute  Fever of 100F or higher  Following upper endoscopy (EGD)  Vomiting of blood or coffee ground material  New  chest pain or pain under the shoulder blades  Painful or persistently difficult swallowing  New shortness of breath  Fever of 100F or higher  Black, tarry-looking stools  For urgent or emergent issues, a gastroenterologist can be reached at any hour by calling [phone]. Do not use MyChart messaging for urgent concerns.    DIET:  We do recommend a small meal at first, but then you may proceed to your regular diet.  Drink plenty of fluids but you should avoid alcoholic beverages for 24 hours.  ACTIVITY:  You should plan to take it easy for the rest of today and you should NOT DRIVE or use heavy machinery until tomorrow (because of the sedation medicines used during the test).    FOLLOW UP: Our staff will call the number listed on your records Wednesday morning between 7:15 am and 8:15 am to check on you and address any questions or concerns that you may have regarding the information given to you following your procedure. If we do not reach you, we will leave a message.  We will attempt to reach you two times.  During this call, we will ask if you have developed any symptoms of COVID 19. If you develop any symptoms (ie: fever, flu-like symptoms, shortness of breath, cough etc.) before then, please call [phone].  If you test positive for Covid 19 in the 2 weeks post procedure, please call and report this information to [place].    If any  biopsies were taken you will be contacted by phone or by letter within the next 1-3 weeks.  Please call us at [phone] if you have not heard about the biopsies in 3 weeks.    SIGNATURES/CONFIDENTIALITY: You and/or your care partner have signed paperwork which will be entered into your electronic medical record.  These signatures attest to the fact that that the information above on your After Visit Summary has been reviewed and is understood.  Full responsibility of the confidentiality of this discharge information lies with you and/or your  care-partner.

## 2021-05-06 NOTE — Op Note (Signed)
C[facility] Patient Name: Cody Flores Procedure Date: 05/06/2021 7:24 AM MRN: [id number] Endoscopist: Gerrit Heck , MD Age: 55 Referring MD:  Date of Birth: 13-Sep-1966 Gender: Male Account #: [id number] Procedure:                Colonoscopy Indications:              Surveillance: Personal history of adenomatous                            polyps on last colonoscopy > 5 years ago                           -Colonoscopy in 2013 with 5 polyps removed (path:                            [facility] and [facility])                           -Partial colectomy in 2013 for complex                            diverticulitis Medicines:                Monitored Anesthesia Care Procedure:                Pre-Anesthesia Assessment:                           - Prior to the procedure, a History and Physical                            was performed, and patient medications and                            allergies were reviewed. The patient's tolerance of                            previous anesthesia was also reviewed. The risks                            and benefits of the procedure and the sedation                            options and risks were discussed with the patient.                            All questions were answered, and informed consent                            was obtained. Prior Anticoagulants: The patient has                            taken no previous anticoagulant or antiplatelet                            agents.  ASA Grade Assessment: III - A patient with                            severe systemic disease. After reviewing the risks                            and benefits, the patient was deemed in                            satisfactory condition to undergo the procedure.                           After obtaining informed consent, the colonoscope                            was passed under direct vision. Throughout the                            procedure, the patient's blood  pressure, pulse, and                            oxygen saturations were monitored continuously. The                            CF [id number] #[id number] was introduced through the anus                            and advanced to the the cecum, identified by                            appendiceal orifice and ileocecal valve. The                            colonoscopy was performed without difficulty. The                            patient tolerated the procedure well. The quality                            of the bowel preparation was good. The ileocecal                            valve, appendiceal orifice, and rectum were                            photographed. Scope In: 8:12:27 AM Scope Out: 8:39:04 AM Scope Withdrawal Time: 0 hours 24 minutes 42 seconds  Total Procedure Duration: 0 hours 26 minutes 37 seconds  Findings:                 Skin tags were found on perianal exam.                           Five sessile polyps were found in the sigmoid colon                            (  2), descending colon, ascending colon, and cecum.                            The polyps were 4 to 8 mm in size. These polyps                            were removed with a cold snare. Resection and                            retrieval were complete. Estimated blood loss was                            minimal.                           Multiple small and large-mouthed diverticula were                            found in the descending colon.                           There was evidence of a prior end-to-end                            colo-colonic anastomosis in the distal sigmoid                            colon. This was patent and was characterized by                            healthy appearing mucosa. The anastomosis was                            traversed.                           The retroflexed view of the distal rectum and anal                            verge was normal and showed no anal or rectal                             abnormalities. Complications:            No immediate complications. Estimated Blood Loss:     Estimated blood loss was minimal. Impression:               - Perianal skin tags found on perianal exam.                           - Five 4 to 8 mm polyps in the sigmoid colon, in                            the descending colon, in the ascending colon and in  the cecum, removed with a cold snare. Resected and                            retrieved.                           - Diverticulosis in the descending colon.                           - Patent end-to-end colo-colonic anastomosis,                            characterized by healthy appearing mucosa.                           - The distal rectum and anal verge are normal on                            retroflexion view. Recommendation:           - Patient has a contact number available for                            emergencies. The signs and symptoms of potential                            delayed complications were discussed with the                            patient. Return to normal activities tomorrow.                            Written discharge instructions were provided to the                            patient.                           - Resume previous diet.                           - Continue present medications.                           - Await pathology results.                           - Repeat colonoscopy for surveillance based on                            pathology results.                           - Return to GI office PRN. Gerrit Heck, MD 05/06/2021 8:51:06 AM

## 2021-05-06 NOTE — Progress Notes (Signed)
Vs CW I have reviewed the patient's medical history in detail and updated the computerized patient record.   

## 2021-05-06 NOTE — Progress Notes (Signed)
PT taken to PACU. Monitors in place. VSS. Report given to RN. 

## 2021-05-08 ENCOUNTER — Telehealth: Payer: Self-pay | Admitting: *Deleted

## 2021-05-08 ENCOUNTER — Telehealth: Payer: Self-pay

## 2021-05-08 NOTE — Telephone Encounter (Signed)
No answer, left message to call back later today, B.Naudia Crosley RN. 

## 2021-05-08 NOTE — Telephone Encounter (Signed)
  Follow up Call-  Call back number 05/06/2021  Post procedure Call Back phone  # [phone]  Permission to leave phone message Yes  Some recent data might be hidden     Patient questions:  Do you have a fever, pain , or abdominal swelling? No. Pain Score  0 *  Have you tolerated food without any problems? Yes.    Have you been able to return to your normal activities? Yes.    Do you have any questions about your discharge instructions: Diet   No. Medications  No. Follow up visit  No.  Do you have questions or concerns about your Care? No.  Actions: * If pain score is 4 or above: No action needed, pain <4.  Have you developed a fever since your procedure? no  2.   Have you had an respiratory symptoms (SOB or cough) since your procedure? no  3.   Have you tested positive for COVID 19 since your procedure no  4.   Have you had any family members/close contacts diagnosed with the COVID 19 since your procedure?  no   If yes to any of these questions please route to Joylene John, RN and Joella Prince, RN

## 2021-05-09 ENCOUNTER — Encounter: Payer: Self-pay | Admitting: Gastroenterology

## 2021-06-24 ENCOUNTER — Ambulatory Visit: Payer: No Typology Code available for payment source | Admitting: Family Medicine

## 2021-07-16 ENCOUNTER — Other Ambulatory Visit (HOSPITAL_COMMUNITY): Payer: Self-pay

## 2021-07-16 MED FILL — Rosuvastatin Calcium Tab 10 MG: ORAL | 90 days supply | Qty: 90 | Fill #1 | Status: AC

## 2021-07-16 MED FILL — Lisinopril & Hydrochlorothiazide Tab 20-25 MG: ORAL | 90 days supply | Qty: 90 | Fill #1 | Status: AC

## 2021-07-22 ENCOUNTER — Ambulatory Visit (INDEPENDENT_AMBULATORY_CARE_PROVIDER_SITE_OTHER): Payer: No Typology Code available for payment source | Admitting: Family Medicine

## 2021-07-22 ENCOUNTER — Other Ambulatory Visit: Payer: Self-pay

## 2021-07-22 ENCOUNTER — Encounter: Payer: Self-pay | Admitting: Family Medicine

## 2021-07-22 VITALS — BP 131/79 | HR 66 | Temp 97.9°F | Ht 70.0 in | Wt 207.0 lb

## 2021-07-22 DIAGNOSIS — J302 Other seasonal allergic rhinitis: Secondary | ICD-10-CM

## 2021-07-22 DIAGNOSIS — L918 Other hypertrophic disorders of the skin: Secondary | ICD-10-CM

## 2021-07-22 DIAGNOSIS — I10 Essential (primary) hypertension: Secondary | ICD-10-CM

## 2021-07-22 DIAGNOSIS — Z23 Encounter for immunization: Secondary | ICD-10-CM | POA: Diagnosis not present

## 2021-07-22 DIAGNOSIS — I714 Abdominal aortic aneurysm, without rupture, unspecified: Secondary | ICD-10-CM

## 2021-07-22 DIAGNOSIS — Z125 Encounter for screening for malignant neoplasm of prostate: Secondary | ICD-10-CM | POA: Diagnosis not present

## 2021-07-22 DIAGNOSIS — E782 Mixed hyperlipidemia: Secondary | ICD-10-CM | POA: Diagnosis not present

## 2021-07-22 DIAGNOSIS — R7303 Prediabetes: Secondary | ICD-10-CM | POA: Diagnosis not present

## 2021-07-22 DIAGNOSIS — Z1159 Encounter for screening for other viral diseases: Secondary | ICD-10-CM

## 2021-07-22 NOTE — Assessment & Plan Note (Signed)
Due for f/u imaging next year.

## 2021-07-22 NOTE — Progress Notes (Addendum)
Cody Flores - 55 y.o. male MRN [id number]  Date of birth: 1966-10-04  Subjective Chief Complaint  Patient presents with   Hyperlipidemia   Hypertension    HPI Cody Flores is a 55 y.o. male here today for follow up visit.  Reports that overall he is doing well.  He would like to start shingles vaccination today.    He continues on lisinopril/hctz for management of HTN.  He is doing well with this and denies side effects related to this.  He has not had chest pain, shortness of breath, palpitations, headache or vision changes.  He does have AAA which needs follow up next year.   He is doing well with crestor for management of lipids.   Has had some issues with allergies and sinus congestion.    He does have a couple skin tags he would like treated today.  ROS:  A comprehensive ROS was completed and negative except as noted per HPI  No Known Allergies  Past Medical History:  Diagnosis Date   Abdominal aortic aneurysm (AAA) ([place]) 05/31/2019   3 cm.  Unchanged November 2018   COPD (chronic obstructive pulmonary disease) (HCC)    Diverticulitis    Emphysema of lung (HCC)    GERD (gastroesophageal reflux disease)    History of colon polyps    HTN (hypertension) 03/05/2017    Past Surgical History:  Procedure Laterality Date   COLECTOMY  2013   Hospital in [place]    COLONOSCOPY  04/09/2012   [facility]    ESOPHAGOGASTRODUODENOSCOPY     over 20 years ago in [place]      Social History   Socioeconomic History   Marital status: Married    Spouse name: Not on file   Number of children: Not on file   Years of education: Not on file   Highest education level: Not on file  Occupational History   Not on file  Tobacco Use   Smoking status: Every Day    Packs/day: 1.50    Types: Cigarettes   Smokeless tobacco: Never  Vaping Use   Vaping Use: Never used  Substance and Sexual  Activity   Alcohol use: Yes    Alcohol/week: 0.0 standard drinks    Comment: ocassionally/rare   Drug use: No   Sexual activity: Not on file  Other Topics Concern   Not on file  Social History Narrative   ** Merged History Encounter **       Social Determinants of Health   Financial Resource Strain: Not on file  Food Insecurity: Not on file  Transportation Needs: Not on file  Physical Activity: Not on file  Stress: Not on file  Social Connections: Not on file    Family History  Problem Relation Age of Onset   Aneurysm Father    Colon cancer Father        said tumor was removed    Diabetes Maternal Grandmother    Esophageal cancer Neg Hx    Colon polyps Neg Hx    Stomach cancer Neg Hx    Rectal cancer Neg Hx     Health Maintenance  Topic Date Due   HIV Screening  Never done   Hepatitis C Screening  Never done   COVID-19 Vaccine (3 - Booster for Moderna series) 11/20/2020   INFLUENZA VACCINE  01/24/2022 (Originally 05/27/2021)   Zoster Vaccines- Shingrix (2 of 2) 09/16/2021  COLONOSCOPY (Pts 45-63yrs Insurance coverage will need to be confirmed)  05/06/2024   TETANUS/TDAP  01/02/2026   HPV VACCINES  Aged Out     ----------------------------------------------------------------------------------------------------------------------------------------------------------------------------------------------------------------- Physical Exam BP 131/79 (BP Location: Left Arm, Patient Position: Sitting, Cuff Size: Normal)   Pulse 66   Temp 97.9 F (36.6 C)   Ht 5\' 10"  (1.778 m)   Wt 207 lb (93.9 kg)   SpO2 98%   BMI 29.70 kg/m   Physical Exam Constitutional:      Appearance: Normal appearance.  Eyes:     General: No scleral icterus. Cardiovascular:     Rate and Rhythm: Normal rate and regular rhythm.  Pulmonary:     Effort: Pulmonary effort is normal.     Breath sounds: Normal breath sounds.  Musculoskeletal:     Cervical back: Neck supple.  Neurological:      General: No focal deficit present.     Mental Status: He is alert.  Psychiatric:        Mood and Affect: Mood normal.        Behavior: Behavior normal.   Cryodestruction of skin tags discussed.  Potential side effects including undesired cosmetic effect, poor healing, infection and damage to adjacent structures discussed.  He elects to proceed with procedure.  Liquid nitrogen applied to 2 skin tags achieving small frost ring surrounding each skin tag.  He tolerated this well.  No immediate complications.  Post procedure instructions were given. ------------------------------------------------------------------------------------------------------------------------------------------------------------------------------------------------------------------- Assessment and Plan  HTN (hypertension) BP remains well controlled.  Continue lisinopril/hctz at current strength.  Low sodium diet recommended.    Seasonal allergies Recommend cetirizine up to twice daily.  Regular use of fluticasone nasal spray.  He may use sudafed for a few days if needed.   Abdominal aortic aneurysm (AAA) ([place]) Due for f/u imaging next year.   HLD (hyperlipidemia) Update lipid panel.  Tolerating rosuvastatin well.    No orders of the defined types were placed in this encounter.   Return in about 6 months (around 01/19/2022) for HTN.    This visit occurred during the SARS-CoV-2 public health emergency.  Safety protocols were in place, including screening questions prior to the visit, additional usage of staff PPE, and extensive cleaning of exam room while observing appropriate contact time as indicated for disinfecting solutions.

## 2021-07-22 NOTE — Assessment & Plan Note (Signed)
Recommend cetirizine up to twice daily.  Regular use of fluticasone nasal spray.  He may use sudafed for a few days if needed.

## 2021-07-22 NOTE — Assessment & Plan Note (Signed)
Update lipid panel.  Tolerating rosuvastatin well.

## 2021-07-22 NOTE — Patient Instructions (Signed)
Great to see you today!  Try using flonase consistently.  DO NOT USE AFRIN FOR MORE THAN 3-4 DAYS Use cetirizine up to twice per day.  You can add sudafed for a few days if needed.   Continue current prescription medications.

## 2021-07-22 NOTE — Assessment & Plan Note (Signed)
BP remains well controlled.  Continue lisinopril/hctz at current strength.  Low sodium diet recommended.

## 2021-07-23 LAB — CBC WITH DIFFERENTIAL/PLATELET
Absolute Monocytes: 788 cells/uL (ref 200–950)
Basophils Absolute: 90 cells/uL (ref 0–200)
Basophils Relative: 1.2 %
Eosinophils Absolute: 210 cells/uL (ref 15–500)
Eosinophils Relative: 2.8 %
HCT: 46.7 % (ref 38.5–50.0)
Hemoglobin: 15.3 g/dL (ref 13.2–17.1)
Lymphs Abs: 1658 cells/uL (ref 850–3900)
MCH: 28.6 pg (ref 27.0–33.0)
MCHC: 32.8 g/dL (ref 32.0–36.0)
MCV: 87.3 fL (ref 80.0–100.0)
MPV: 10.5 fL (ref 7.5–12.5)
Monocytes Relative: 10.5 %
Neutro Abs: 4755 cells/uL (ref 1500–7800)
Neutrophils Relative %: 63.4 %
Platelets: 249 10*3/uL (ref 140–400)
RBC: 5.35 10*6/uL (ref 4.20–5.80)
RDW: 13.8 % (ref 11.0–15.0)
Total Lymphocyte: 22.1 %
WBC: 7.5 10*3/uL (ref 3.8–10.8)

## 2021-07-23 LAB — COMPLETE METABOLIC PANEL WITH GFR
AG Ratio: 1.8 (calc) (ref 1.0–2.5)
ALT: 15 U/L (ref 9–46)
AST: 16 U/L (ref 10–35)
Albumin: 4.9 g/dL (ref 3.6–5.1)
Alkaline phosphatase (APISO): 71 U/L (ref 35–144)
BUN: 22 mg/dL (ref 7–25)
CO2: 27 mmol/L (ref 20–32)
Calcium: 9.8 mg/dL (ref 8.6–10.3)
Chloride: 100 mmol/L (ref 98–110)
Creat: 1.08 mg/dL (ref 0.70–1.30)
Globulin: 2.7 g/dL (calc) (ref 1.9–3.7)
Glucose, Bld: 118 mg/dL — ABNORMAL HIGH (ref 65–99)
Potassium: 4.4 mmol/L (ref 3.5–5.3)
Sodium: 136 mmol/L (ref 135–146)
Total Bilirubin: 0.3 mg/dL (ref 0.2–1.2)
Total Protein: 7.6 g/dL (ref 6.1–8.1)
eGFR: 81 mL/min/{1.73_m2} (ref >=60)

## 2021-07-23 LAB — PSA: PSA: 1.08 ng/mL (ref <=4.00)

## 2021-07-23 LAB — HEPATITIS C ANTIBODY
Hepatitis C Ab: NONREACTIVE
SIGNAL TO CUT-OFF: 0.01 (ref <1.00)

## 2021-07-23 LAB — LIPID PANEL W/REFLEX DIRECT LDL
Cholesterol: 173 mg/dL (ref <200)
HDL: 41 mg/dL (ref >=40)
LDL Cholesterol (Calc): 116 mg/dL (calc) — ABNORMAL HIGH
Non-HDL Cholesterol (Calc): 132 mg/dL (calc) — ABNORMAL HIGH (ref <130)
Total CHOL/HDL Ratio: 4.2 (calc) (ref <5.0)
Triglycerides: 67 mg/dL (ref <150)

## 2021-07-23 LAB — HEMOGLOBIN A1C
Hgb A1c MFr Bld: 6.1 % of total Hgb — ABNORMAL HIGH (ref <5.7)
Mean Plasma Glucose: 128 mg/dL
eAG (mmol/L): 7.1 mmol/L

## 2021-10-09 MED FILL — Lisinopril & Hydrochlorothiazide Tab 20-25 MG: ORAL | 90 days supply | Qty: 90 | Fill #2 | Status: AC

## 2021-10-09 MED FILL — Rosuvastatin Calcium Tab 10 MG: ORAL | 90 days supply | Qty: 90 | Fill #2 | Status: AC

## 2021-10-10 ENCOUNTER — Other Ambulatory Visit (HOSPITAL_COMMUNITY): Payer: Self-pay

## 2022-01-10 ENCOUNTER — Other Ambulatory Visit: Payer: Self-pay | Admitting: Family Medicine

## 2022-01-13 ENCOUNTER — Other Ambulatory Visit (HOSPITAL_COMMUNITY): Payer: Self-pay

## 2022-01-13 MED ORDER — ROSUVASTATIN CALCIUM 10 MG PO TABS
ORAL_TABLET | Freq: Every day | ORAL | 3 refills | Status: DC
  Filled 2022-01-13: qty 90, 90d supply, fill #0
  Filled 2022-04-12: qty 90, 90d supply, fill #1
  Filled 2022-07-09: qty 90, 90d supply, fill #2
  Filled 2022-10-06: qty 90, 90d supply, fill #3

## 2022-01-13 MED ORDER — LISINOPRIL-HYDROCHLOROTHIAZIDE 20-25 MG PO TABS
1.0000 | ORAL_TABLET | Freq: Every day | ORAL | 3 refills | Status: DC
  Filled 2022-01-13: qty 90, 90d supply, fill #0
  Filled 2022-04-12: qty 90, 90d supply, fill #1
  Filled 2022-07-09: qty 90, 90d supply, fill #2
  Filled 2022-10-06: qty 90, 90d supply, fill #3

## 2022-01-14 ENCOUNTER — Other Ambulatory Visit (HOSPITAL_COMMUNITY): Payer: Self-pay

## 2022-01-20 ENCOUNTER — Other Ambulatory Visit: Payer: Self-pay

## 2022-01-20 ENCOUNTER — Encounter: Payer: Self-pay | Admitting: Family Medicine

## 2022-01-20 ENCOUNTER — Ambulatory Visit (INDEPENDENT_AMBULATORY_CARE_PROVIDER_SITE_OTHER): Payer: No Typology Code available for payment source | Admitting: Family Medicine

## 2022-01-20 VITALS — BP 118/73 | HR 61 | Ht 70.0 in | Wt 210.0 lb

## 2022-01-20 DIAGNOSIS — Z23 Encounter for immunization: Secondary | ICD-10-CM | POA: Diagnosis not present

## 2022-01-20 DIAGNOSIS — I10 Essential (primary) hypertension: Secondary | ICD-10-CM | POA: Diagnosis not present

## 2022-01-20 DIAGNOSIS — I70219 Atherosclerosis of native arteries of extremities with intermittent claudication, unspecified extremity: Secondary | ICD-10-CM

## 2022-01-20 DIAGNOSIS — I7143 Infrarenal abdominal aortic aneurysm, without rupture: Secondary | ICD-10-CM | POA: Diagnosis not present

## 2022-01-20 DIAGNOSIS — I77819 Aortic ectasia, unspecified site: Secondary | ICD-10-CM

## 2022-01-20 DIAGNOSIS — F17210 Nicotine dependence, cigarettes, uncomplicated: Secondary | ICD-10-CM

## 2022-01-20 NOTE — Progress Notes (Signed)
?Cody Flores - 56 y.o. male MRN [id number]  Date of birth: 03-04-1966 ? ?Subjective ?Chief Complaint  ?Patient presents with  ? Hypertension  ? ? ?HPI ?Cody Flores is a 56 year old male here today for follow-up visit. ? ?Blood pressure has remained well controlled with lisinopril/hydrochlorothiazide.  No issues with tolerating medication.  He has not had headaches, chest pain, shortness of breath, palpitations or vision changes. ? ?He does report having some cramping sensation bilateral legs.  This has been worse in the left leg.  He does have history of AAA and continues to smoke.  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   ? ?He would like to have shingles vaccine today. ? ?ROS:  A comprehensive ROS was completed and negative except as noted per HPI ? ?No Known Allergies ? ?Past Medical History:  ?Diagnosis Date  ? Abdominal aortic aneurysm (AAA) 05/31/2019  ? 3 cm.  Unchanged November 2018  ? COPD (chronic obstructive pulmonary disease) ([place])   ? Diverticulitis   ? Emphysema of lung ([place])   ? GERD (gastroesophageal reflux disease)   ? History of colon polyps   ? HTN (hypertension) 03/05/2017  ? ? ?Past Surgical History:  ?Procedure Laterality Date  ? COLECTOMY  2013  ? Hospital in [place]   ? COLONOSCOPY  04/09/2012  ? [facility]   ? ESOPHAGOGASTRODUODENOSCOPY    ? over 20 years ago in [place]  ? LAPAROSCOPIC SIGMOID COLECTOMY    ? VASECTOMY    ? ? ?Social History  ? ?Socioeconomic History  ? Marital status: Married  ?  Spouse name: Not on file  ? Number of children: Not on file  ? Years of education: Not on file  ? Highest education level: Not on file  ?Occupational History  ? Not on file  ?Tobacco Use  ? Smoking status: Every Day  ?  Packs/day: 1.50  ?  Types: Cigarettes  ? Smokeless tobacco: Never  ?Vaping Use  ? Vaping Use: Never used  ?Substance and Sexual Activity  ? Alcohol use: Yes  ?  Alcohol/week: 0.0 standard drinks  ?  Comment:  ocassionally/rare  ? Drug use: No  ? Sexual activity: Not on file  ?Other Topics Concern  ? Not on file  ?Social History Narrative  ? ** Merged History Encounter **  ?    ? ?Social Determinants of Health  ? ?Financial Resource Strain: Not on file  ?Food Insecurity: Not on file  ?Transportation Needs: Not on file  ?Physical Activity: Not on file  ?Stress: Not on file  ?Social Connections: Not on file  ? ? ?Family History  ?Problem Relation Age of Onset  ? Aneurysm Father   ? Colon cancer Father   ?     said tumor was removed   ? Diabetes Maternal Grandmother   ? Esophageal cancer Neg Hx   ? Colon polyps Neg Hx   ? Stomach cancer Neg Hx   ? Rectal cancer Neg Hx   ? ? ?Health Maintenance  ?Topic Date Due  ? INFLUENZA VACCINE  01/24/2022 (Originally 05/27/2021)  ? COVID-19 Vaccine (3 - Moderna risk series) 06/27/2022 (Originally 07/18/2020)  ? HIV Screening  07/22/2022 (Originally 01/12/1980)  ? COLONOSCOPY (Pts 45-28yr Insurance coverage will need to be confirmed)  05/06/2024  ? TETANUS/TDAP  01/02/2026  ? Hepatitis C Screening  Completed  ? Zoster Vaccines- Shingrix  Completed  ? HPV VACCINES  Aged Out  ? ? ? ?----------------------------------------------------------------------------------------------------------------------------------------------------------------------------------------------------------------- ?Physical  Exam ?BP 118/73 (BP Location: Left Arm, Patient Position: Sitting, Cuff Size: Normal)   Pulse 61   Ht '5\' 10"'$  (1.778 m)   Wt 210 lb (95.3 kg)   SpO2 97%   BMI 30.13 kg/m?  ? ?Physical Exam ?Constitutional:   ?   Appearance: Normal appearance.  ?Eyes:  ?   General: No scleral icterus. ?Cardiovascular:  ?   Rate and Rhythm: Normal rate and regular rhythm.  ?   Pulses: Normal pulses.  ?Pulmonary:  ?   Effort: Pulmonary effort is normal.  ?   Breath sounds: Normal breath sounds.  ?Musculoskeletal:  ?   Cervical back: Neck supple.  ?Neurological:  ?   Mental Status: He is alert.  ?Psychiatric:     ?    Mood and Affect: Mood normal.     ?   Behavior: Behavior normal.  ? ? ?------------------------------------------------------------------------------------------------------------------------------------------------------------------------------------------------------------------- ?Assessment and Plan ? ?Abdominal aortic aneurysm (AAA) ([place]) ?He is due for follow-up of AAA.  Repeat ultrasound ordered.  He is having some claudication symptoms so we will also get iliacs as well as lower extremity artery duplex. ? ?HTN (hypertension) ?Blood pressure stable at this time.  Recommend continuation of current medications. ? ?Nicotine dependence ?Counseled on smoking cessation.  He is able to reduce his cigarette usage on the weekends however this picks back up while he is at work during the week.  Declines any interventions at this time. ? ? ?No orders of the defined types were placed in this encounter. ? ? ?Return in about 6 months (around 07/23/2022) for HTN. ? ? ? ?This visit occurred during the SARS-CoV-2 public health emergency.  Safety protocols were in place, including screening questions prior to the visit, additional usage of staff PPE, and extensive cleaning of exam room while observing appropriate contact time as indicated for disinfecting solutions.  ? ?

## 2022-01-20 NOTE — Assessment & Plan Note (Signed)
Blood pressure stable at this time.  Recommend continuation of current medications. 

## 2022-01-20 NOTE — Assessment & Plan Note (Signed)
Counseled on smoking cessation.  He is able to reduce his cigarette usage on the weekends however this picks back up while he is at work during the week.  Declines any interventions at this time. ?

## 2022-01-20 NOTE — Assessment & Plan Note (Signed)
He is due for follow-up of AAA.  Repeat ultrasound ordered.  He is having some claudication symptoms so we will also get iliacs as well as lower extremity artery duplex. ?

## 2022-01-20 NOTE — Patient Instructions (Addendum)
I have ordered imaging tests for follow up of the aorta.  ?Continue to work on quitting smoking.  Think about medication for anxiety.   ?See me again in 6 months or sooner if needed.  ?

## 2022-02-11 ENCOUNTER — Other Ambulatory Visit: Payer: Self-pay | Admitting: Family Medicine

## 2022-02-11 DIAGNOSIS — I70219 Atherosclerosis of native arteries of extremities with intermittent claudication, unspecified extremity: Secondary | ICD-10-CM

## 2022-02-20 ENCOUNTER — Ambulatory Visit (HOSPITAL_BASED_OUTPATIENT_CLINIC_OR_DEPARTMENT_OTHER)
Admission: RE | Admit: 2022-02-20 | Discharge: 2022-02-20 | Disposition: A | Discharge status: Home / Self Care | Payer: No Typology Code available for payment source | Source: Ambulatory Visit | Attending: Family Medicine | Admitting: Family Medicine

## 2022-02-20 ENCOUNTER — Ambulatory Visit (HOSPITAL_COMMUNITY)
Admission: RE | Admit: 2022-02-20 | Discharge: 2022-02-20 | Disposition: A | Discharge status: Home / Self Care | Payer: No Typology Code available for payment source | Source: Ambulatory Visit | Attending: Cardiology | Admitting: Cardiology

## 2022-02-20 DIAGNOSIS — I7143 Infrarenal abdominal aortic aneurysm, without rupture: Secondary | ICD-10-CM | POA: Insufficient documentation

## 2022-02-20 DIAGNOSIS — I70219 Atherosclerosis of native arteries of extremities with intermittent claudication, unspecified extremity: Secondary | ICD-10-CM | POA: Insufficient documentation

## 2022-02-20 DIAGNOSIS — I70213 Atherosclerosis of native arteries of extremities with intermittent claudication, bilateral legs: Secondary | ICD-10-CM

## 2022-02-21 IMAGING — US US AORTA
1 series · 14 of 23 positions shown · non-contrast
Comparison: CT abdomen and pelvis October 15, 2017

CLINICAL DATA: Reported abdominal aortic aneurysm

EXAM:
ULTRASOUND OF ABDOMINAL AORTA
TECHNIQUE: Ultrasound examination of the abdominal aorta and proximal common
iliac arteries was performed to evaluate for aneurysm. Additional
color and Doppler images of the distal aorta were obtained to
document patency.

[Series 1: us aorta · 0.29mm/px · 14 of 23 slices shown]
[im 1/23]
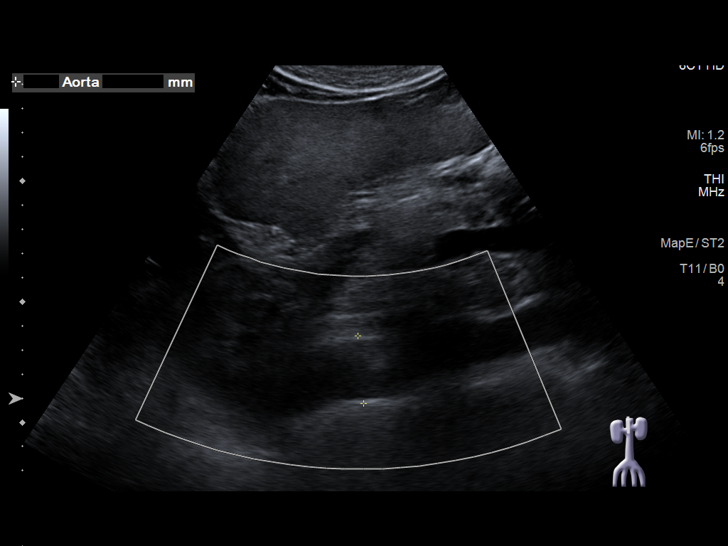
[im 3/23]
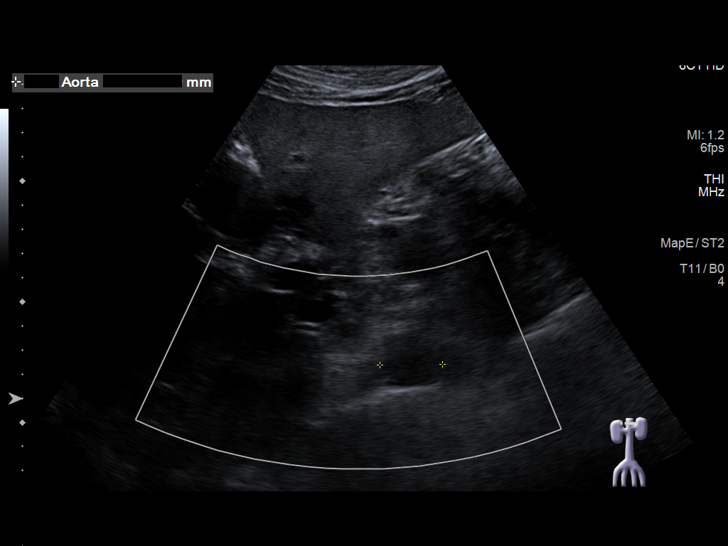
[im 5/23]
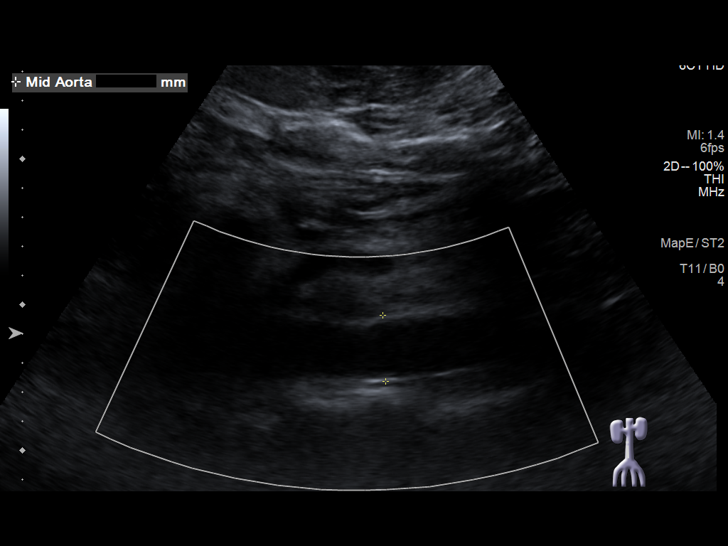
[im 6/23]
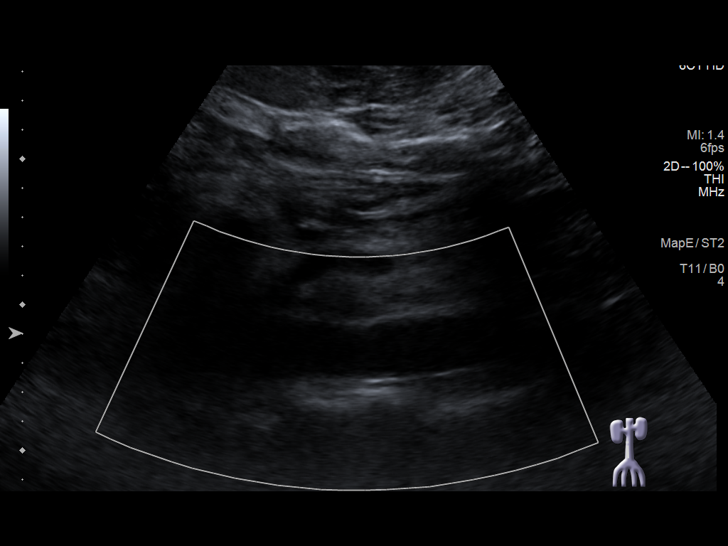
[im 8/23]
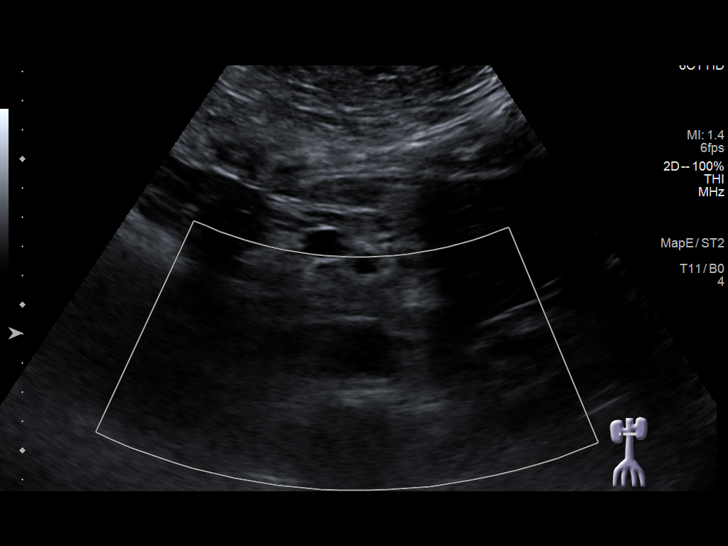
[im 10/23]
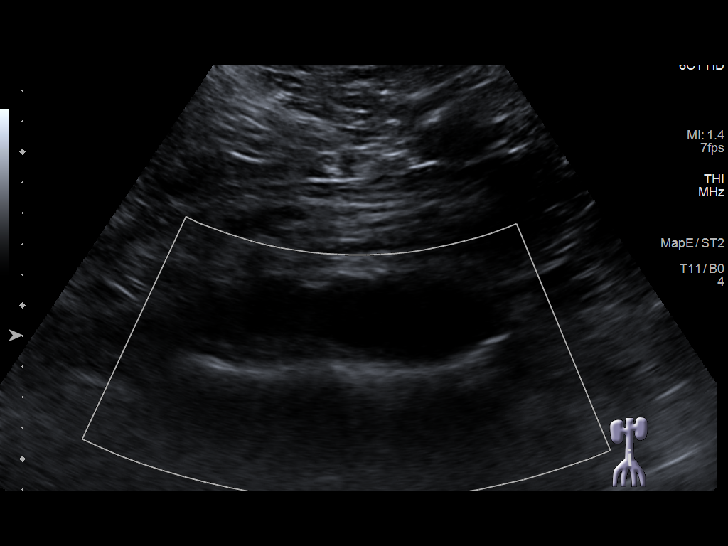
[im 11/23]
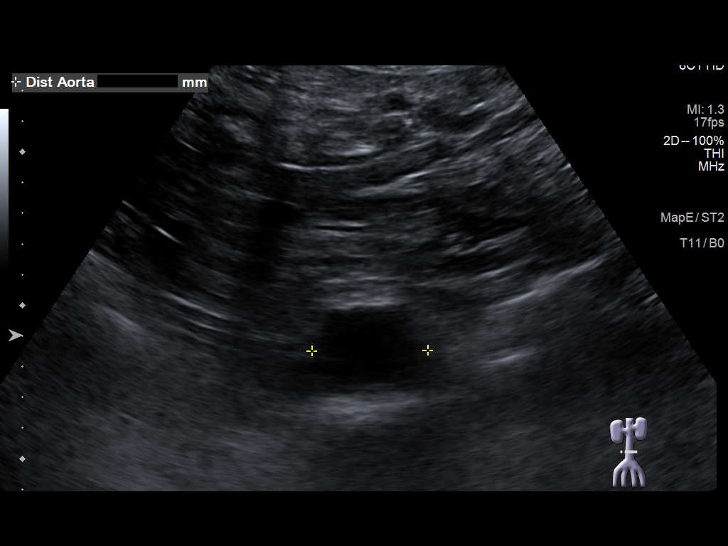
[im 13/23]
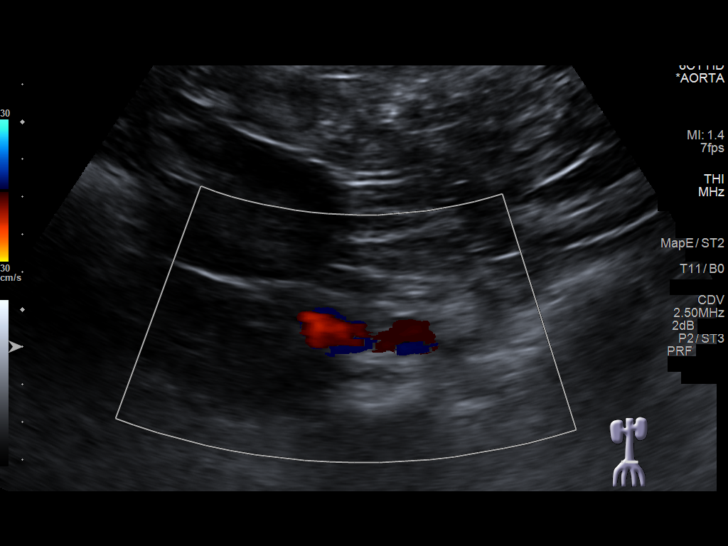
[im 14/23]
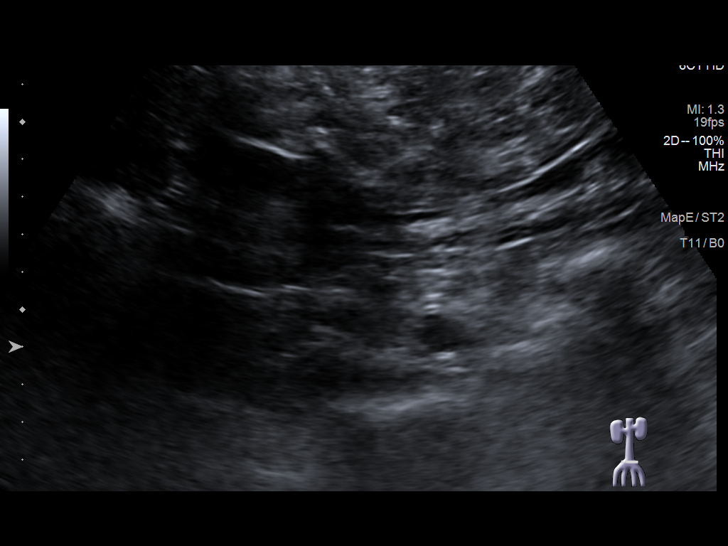
[im 16/23]
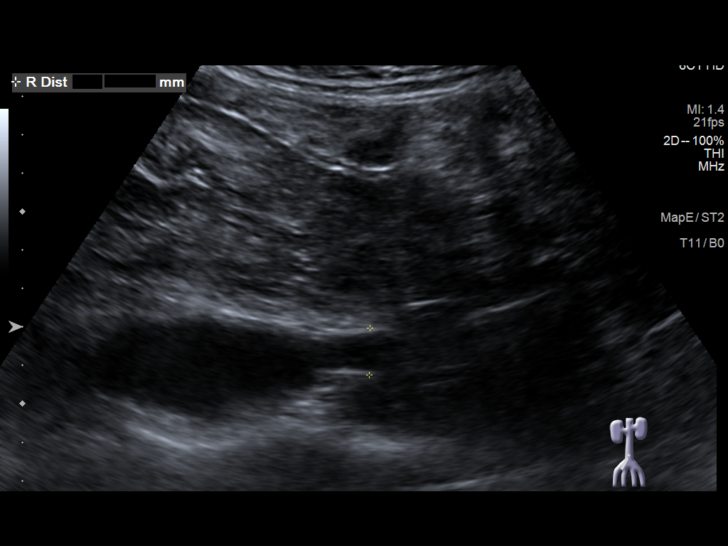
[im 18/23]
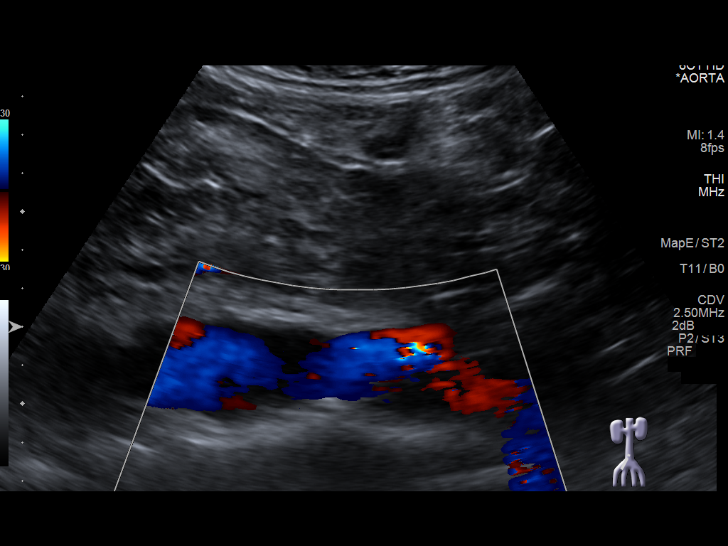
[im 19/23]
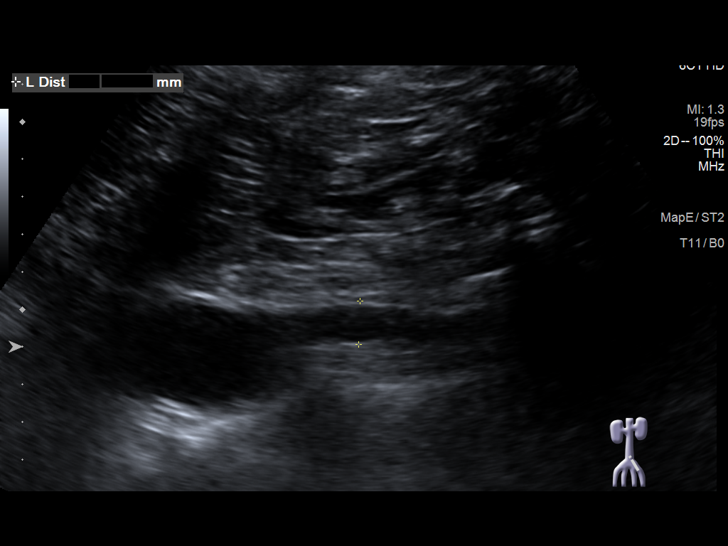
[im 21/23]
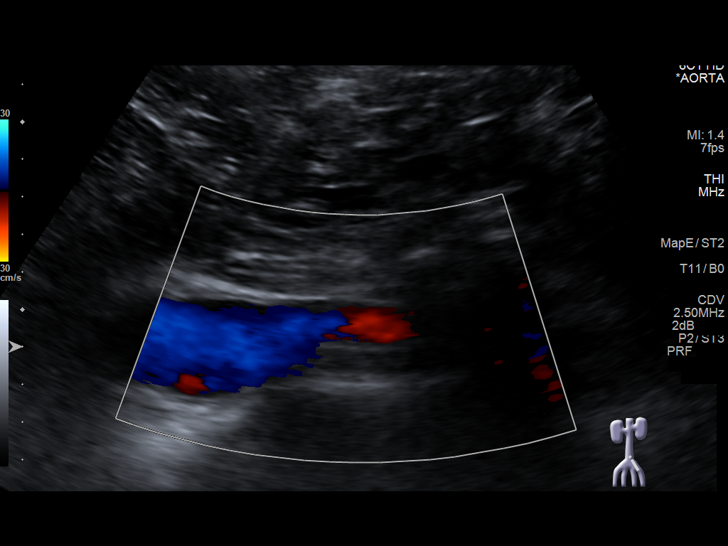
[im 23/23]
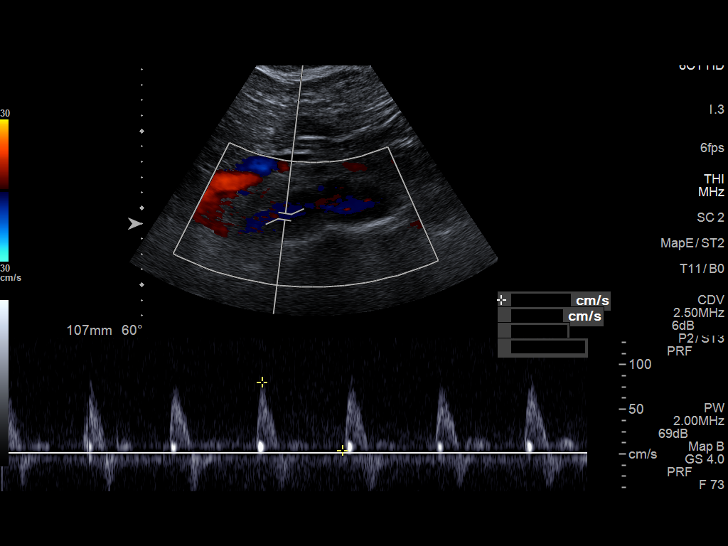

[14 of 23 positions shown; findings below may reference images not displayed]

FINDINGS: Abdominal aortic measurements as follows:

Proximal:  2.8 x 2.6  cm

Mid:  2.3 x 2.7  cm

Distal:  3.5 x 3.8 cm
Patent: Yes, peak systolic velocity is 80 cm/s

Right common iliac artery: 1.2 x 1.3 cm

Left common iliac artery: 1.2 x 1.3 cm

No periaortic fluid or adenopathy
IMPRESSION: Dilatation of the distal aorta with a maximum transverse diameter of
3.5 x 3.8 cm. Recommend follow-up every 2 years. This recommendation
follows ACR consensus guidelines: White Paper of the ACR Incidental
[DATE]. No periaortic adenopathy or fluid.

## 2022-04-12 ENCOUNTER — Other Ambulatory Visit (HOSPITAL_COMMUNITY): Payer: Self-pay

## 2022-07-09 ENCOUNTER — Other Ambulatory Visit (HOSPITAL_COMMUNITY): Payer: Self-pay

## 2022-08-04 ENCOUNTER — Encounter: Payer: Self-pay | Admitting: Family Medicine

## 2022-08-04 ENCOUNTER — Ambulatory Visit (INDEPENDENT_AMBULATORY_CARE_PROVIDER_SITE_OTHER): Payer: No Typology Code available for payment source | Admitting: Family Medicine

## 2022-08-04 ENCOUNTER — Other Ambulatory Visit (HOSPITAL_COMMUNITY): Payer: Self-pay

## 2022-08-04 VITALS — BP 111/69 | HR 58 | Ht 70.0 in | Wt 214.0 lb

## 2022-08-04 DIAGNOSIS — E782 Mixed hyperlipidemia: Secondary | ICD-10-CM

## 2022-08-04 DIAGNOSIS — L309 Dermatitis, unspecified: Secondary | ICD-10-CM | POA: Insufficient documentation

## 2022-08-04 DIAGNOSIS — I7143 Infrarenal abdominal aortic aneurysm, without rupture: Secondary | ICD-10-CM

## 2022-08-04 DIAGNOSIS — I10 Essential (primary) hypertension: Secondary | ICD-10-CM

## 2022-08-04 DIAGNOSIS — Z122 Encounter for screening for malignant neoplasm of respiratory organs: Secondary | ICD-10-CM

## 2022-08-04 DIAGNOSIS — J329 Chronic sinusitis, unspecified: Secondary | ICD-10-CM

## 2022-08-04 DIAGNOSIS — R7303 Prediabetes: Secondary | ICD-10-CM | POA: Diagnosis not present

## 2022-08-04 DIAGNOSIS — F17210 Nicotine dependence, cigarettes, uncomplicated: Secondary | ICD-10-CM

## 2022-08-04 DIAGNOSIS — Z125 Encounter for screening for malignant neoplasm of prostate: Secondary | ICD-10-CM

## 2022-08-04 MED ORDER — TRIAMCINOLONE ACETONIDE 0.1 % EX CREA
1.0000 | TOPICAL_CREAM | Freq: Two times a day (BID) | CUTANEOUS | 0 refills | Status: DC
  Filled 2022-08-04: qty 30, 15d supply, fill #0
  Filled 2023-01-02 (×2): qty 30, 15d supply, fill #1
  Filled 2023-02-16 – 2023-02-17 (×2): qty 30, 15d supply, fill #2
  Filled 2023-05-14: qty 30, 15d supply, fill #3
  Filled 2023-06-16 – 2023-06-17 (×2): qty 30, 15d supply, fill #4

## 2022-08-04 MED ORDER — DOXYCYCLINE HYCLATE 100 MG PO TABS
100.0000 mg | ORAL_TABLET | Freq: Two times a day (BID) | ORAL | 0 refills | Status: DC
  Filled 2022-08-04: qty 20, 10d supply, fill #0

## 2022-08-04 MED ORDER — PREDNISONE 10 MG (21) PO TBPK
ORAL_TABLET | ORAL | 0 refills | Status: DC
  Filled 2022-08-04: qty 21, 6d supply, fill #0

## 2022-08-04 MED ORDER — ALBUTEROL SULFATE HFA 108 (90 BASE) MCG/ACT IN AERS
2.0000 | INHALATION_SPRAY | Freq: Four times a day (QID) | RESPIRATORY_TRACT | 11 refills | Status: DC | PRN
  Filled 2022-08-04: qty 6.7, 25d supply, fill #0
  Filled 2022-10-06: qty 6.7, 25d supply, fill #1
  Filled 2023-02-16 – 2023-02-17 (×2): qty 6.7, 25d supply, fill #2
  Filled 2023-05-14: qty 6.7, 25d supply, fill #3

## 2022-08-04 NOTE — Assessment & Plan Note (Signed)
Update a1c 

## 2022-08-04 NOTE — Assessment & Plan Note (Signed)
Add doxycycline and prednisone. If not clearing with this.  Recommend ENT referral.

## 2022-08-04 NOTE — Assessment & Plan Note (Signed)
Topical triamcinolone added.

## 2022-08-04 NOTE — Assessment & Plan Note (Signed)
Tolerating rosuvastatin.  Update lipid panel.

## 2022-08-04 NOTE — Assessment & Plan Note (Signed)
Continue statin.  Encouraged smoking cessation.

## 2022-08-04 NOTE — Assessment & Plan Note (Signed)
BP is well controlled at this time.  Continue lisinopril/hctz at current strength.  Updating labs today.

## 2022-08-04 NOTE — Patient Instructions (Signed)
Start doxycycline and prednisone for sinus pain/pressure.  If this doesn't clear up let me know.   You will be contacted to set up lung cancer screening.

## 2022-08-04 NOTE — Progress Notes (Signed)
[facility] - 56 y.o. male MRN [id number]  Date of birth: [birth year]  Subjective Chief Complaint  Patient presents with   Hypertension    HPI Cody Flores is a 55 y.o. male here today for follow up.    Continues on lisinopril/hctz for management of HTN.  He is tolerating well.  Denies side effects from medication.  Has not had symptoms related to HTN including chest pain, shortness of breath, palpitations, headache or vision changes.  He does continue to smoke.   History of AAA.  Stable on [place] form 01/2022.  Continues on crestor as well.  He has had problems with recurrent sinusitis.  Currently has pressure and pain throughout his sinuses.  Improved with antibiotic and steroids previously, but returned a few weeks later.    He is interested in lung cancer screening.  ROS:  A comprehensive ROS was completed and negative except as noted per HPI    No Known Allergies  Past Medical History:  Diagnosis Date   Abdominal aortic aneurysm (AAA) ([place]) 05/31/2019   3 cm.  Unchanged November 2018   COPD (chronic obstructive pulmonary disease) (H[facility])    Diverticulitis    Emphysema of lung (HCC)    GERD (gastroesophageal reflux disease)    History of colon polyps    HTN (hypertension) 03/05/2017    Past Surgical History:  Procedure Laterality Date   COLECTOMY  2013   Hospital in [place]    COLONOSCOPY  04/09/2012   [facility]    ESOPHAGOGASTRODUODENOSCOPY     over 20 years ago in [place]      Social History   Socioeconomic History   Marital status: Married    Spouse name: Not on file   Number of children: Not on file   Years of education: Not on file   Highest education level: Not on file  Occupational History   Not on file  Tobacco Use   Smoking status: Every Day    Packs/day: 1.50    Types: Cigarettes   Smokeless tobacco: Never  Vaping Use   Vaping Use: Never used  Substance  and Sexual Activity   Alcohol use: Yes    Alcohol/week: 0.0 standard drinks of alcohol    Comment: ocassionally/rare   Drug use: No   Sexual activity: Not on file  Other Topics Concern   Not on file  Social History Narrative   ** Merged History Encounter **       Social Determinants of Health   Financial Resource Strain: Not on file  Food Insecurity: Not on file  Transportation Needs: Not on file  Physical Activity: Not on file  Stress: Not on file  Social Connections: Not on file    Family History  Problem Relation Age of Onset   Aneurysm Father    Colon cancer Father        said tumor was removed    Diabetes Maternal Grandmother    Esophageal cancer Neg Hx    Colon polyps Neg Hx    Stomach cancer Neg Hx    Rectal cancer Neg Hx     Health Maintenance  Topic Date Due   COVID-19 Vaccine (3 - Moderna risk series) 11/27/2022 (Originally 07/18/2020)   INFLUENZA VACCINE  01/25/2023 (Originally 05/27/2022)   HIV Screening  08/05/2023 (Originally 01/12/1980)   COLONOSCOPY (Pts 45-15yr Insurance coverage will need to be confirmed)  05/06/2024   TETANUS/TDAP  01/02/2026  Hepatitis C Screening  Completed   Zoster Vaccines- Shingrix  Completed   HPV VACCINES  Aged Out     ----------------------------------------------------------------------------------------------------------------------------------------------------------------------------------------------------------------- Physical Exam BP 111/69 (BP Location: Left Arm, Patient Position: Sitting, Cuff Size: Large)   Pulse (!) 58   Ht '5\' 10"'$  (1.778 m)   Wt 214 lb (97.1 kg)   SpO2 97%   BMI 30.71 kg/m   Physical Exam Constitutional:      Appearance: Normal appearance.  Eyes:     General: No scleral icterus. Cardiovascular:     Rate and Rhythm: Regular rhythm. Bradycardia present.  Pulmonary:     Effort: Pulmonary effort is normal.     Breath sounds: Normal breath sounds.  Musculoskeletal:     Cervical back:  Neck supple.  Neurological:     Mental Status: He is alert.  Psychiatric:        Mood and Affect: Mood normal.        Behavior: Behavior normal.     ------------------------------------------------------------------------------------------------------------------------------------------------------------------------------------------------------------------- Assessment and Plan  HTN (hypertension) BP is well controlled at this time.  Continue lisinopril/hctz at current strength.  Updating labs today.   Abdominal aortic aneurysm (AAA) ([facility]) Continue statin.  Encouraged smoking cessation.   Nicotine dependence Recommend that he quit.  Counseling provided.  He has tried multiple medications to help with cessation in the past without success.  Low dose CT scan for lung cancer screening ordered.   Prediabetes Update a1c.   HLD (hyperlipidemia) Tolerating rosuvastatin.  Update lipid panel.   Recurrent sinusitis Add doxycycline and prednisone. If not clearing with this.  Recommend ENT referral.   Eczema Topical triamcinolone added.    Meds ordered this encounter  Medications   albuterol (VENTOLIN HFA) 108 (90 Base) MCG/ACT inhaler    Sig: Inhale 2 puffs into the lungs every 6 (six) hours as needed for wheezing or shortness of breath.    Dispense:  8 g    Refill:  11   doxycycline (VIBRA-TABS) 100 MG tablet    Sig: Take 1 tablet (100 mg total) by mouth 2 (two) times daily.    Dispense:  20 tablet    Refill:  0   predniSONE (STERAPRED UNI-PAK 21 TAB) 10 MG (21) TBPK tablet    Sig: Take as directed on packaging.    Dispense:  21 tablet    Refill:  0   triamcinolone cream (KENALOG) 0.1 %    Sig: Apply 1 Application topically 2 (two) times daily.    Dispense:  450 g    Refill:  0    Return in about 6 months (around 02/03/2023) for HTN.    This visit occurred during the SARS-CoV-2 public health emergency.  Safety protocols were in place, including screening questions prior  to the visit, additional usage of staff PPE, and extensive cleaning of exam room while observing appropriate contact time as indicated for disinfecting solutions.

## 2022-08-04 NOTE — Assessment & Plan Note (Addendum)
Recommend that he quit.  Counseling provided.  He has tried multiple medications to help with cessation in the past without success.  Low dose CT scan for lung cancer screening ordered.

## 2022-08-05 LAB — COMPLETE METABOLIC PANEL WITH GFR
AG Ratio: 1.7 (calc) (ref 1.0–2.5)
ALT: 17 U/L (ref 9–46)
AST: 16 U/L (ref 10–35)
Albumin: 4.6 g/dL (ref 3.6–5.1)
Alkaline phosphatase (APISO): 61 U/L (ref 35–144)
BUN: 18 mg/dL (ref 7–25)
CO2: 28 mmol/L (ref 20–32)
Calcium: 9.7 mg/dL (ref 8.6–10.3)
Chloride: 97 mmol/L — ABNORMAL LOW (ref 98–110)
Creat: 0.98 mg/dL (ref 0.70–1.30)
Globulin: 2.7 g/dL (calc) (ref 1.9–3.7)
Glucose, Bld: 124 mg/dL — ABNORMAL HIGH (ref 65–99)
Potassium: 4.5 mmol/L (ref 3.5–5.3)
Sodium: 135 mmol/L (ref 135–146)
Total Bilirubin: 0.3 mg/dL (ref 0.2–1.2)
Total Protein: 7.3 g/dL (ref 6.1–8.1)
eGFR: 90 mL/min/{1.73_m2} (ref >=60)

## 2022-08-05 LAB — CBC WITH DIFFERENTIAL/PLATELET
Absolute Monocytes: 762 cells/uL (ref 200–950)
Basophils Absolute: 89 cells/uL (ref 0–200)
Basophils Relative: 1.2 %
Eosinophils Absolute: 170 cells/uL (ref 15–500)
Eosinophils Relative: 2.3 %
HCT: 41.4 % (ref 38.5–50.0)
Hemoglobin: 14.3 g/dL (ref 13.2–17.1)
Lymphs Abs: 1658 cells/uL (ref 850–3900)
MCH: 29.7 pg (ref 27.0–33.0)
MCHC: 34.5 g/dL (ref 32.0–36.0)
MCV: 85.9 fL (ref 80.0–100.0)
MPV: 9.9 fL (ref 7.5–12.5)
Monocytes Relative: 10.3 %
Neutro Abs: 4721 cells/uL (ref 1500–7800)
Neutrophils Relative %: 63.8 %
Platelets: 215 10*3/uL (ref 140–400)
RBC: 4.82 10*6/uL (ref 4.20–5.80)
RDW: 13.3 % (ref 11.0–15.0)
Total Lymphocyte: 22.4 %
WBC: 7.4 10*3/uL (ref 3.8–10.8)

## 2022-08-05 LAB — LIPID PANEL W/REFLEX DIRECT LDL
Cholesterol: 122 mg/dL (ref <200)
HDL: 41 mg/dL (ref >=40)
LDL Cholesterol (Calc): 65 mg/dL (calc)
Non-HDL Cholesterol (Calc): 81 mg/dL (calc) (ref <130)
Total CHOL/HDL Ratio: 3 (calc) (ref <5.0)
Triglycerides: 80 mg/dL (ref <150)

## 2022-08-05 LAB — HEMOGLOBIN A1C
Hgb A1c MFr Bld: 7.1 % of total Hgb — ABNORMAL HIGH (ref <5.7)
Mean Plasma Glucose: 157 mg/dL
eAG (mmol/L): 8.7 mmol/L

## 2022-08-05 LAB — PSA: PSA: 0.78 ng/mL (ref <=4.00)

## 2022-08-06 ENCOUNTER — Other Ambulatory Visit (HOSPITAL_COMMUNITY): Payer: Self-pay

## 2022-08-14 ENCOUNTER — Other Ambulatory Visit: Payer: Self-pay | Admitting: Family Medicine

## 2022-08-15 ENCOUNTER — Encounter: Payer: Self-pay | Admitting: Family Medicine

## 2022-08-26 ENCOUNTER — Other Ambulatory Visit (HOSPITAL_COMMUNITY): Payer: Self-pay

## 2022-08-26 MED ORDER — METFORMIN HCL ER 500 MG PO TB24
1000.0000 mg | ORAL_TABLET | Freq: Every day | ORAL | 1 refills | Status: DC
  Filled 2022-08-26: qty 180, 90d supply, fill #0

## 2022-09-01 ENCOUNTER — Ambulatory Visit (INDEPENDENT_AMBULATORY_CARE_PROVIDER_SITE_OTHER): Payer: No Typology Code available for payment source

## 2022-09-01 DIAGNOSIS — Z122 Encounter for screening for malignant neoplasm of respiratory organs: Secondary | ICD-10-CM

## 2022-09-01 DIAGNOSIS — F17210 Nicotine dependence, cigarettes, uncomplicated: Secondary | ICD-10-CM | POA: Diagnosis not present

## 2022-10-06 ENCOUNTER — Other Ambulatory Visit (HOSPITAL_COMMUNITY): Payer: Self-pay

## 2023-01-02 ENCOUNTER — Other Ambulatory Visit: Payer: Self-pay

## 2023-01-02 ENCOUNTER — Other Ambulatory Visit: Payer: Self-pay | Admitting: Family Medicine

## 2023-01-02 MED ORDER — LISINOPRIL-HYDROCHLOROTHIAZIDE 20-25 MG PO TABS
1.0000 | ORAL_TABLET | Freq: Every day | ORAL | 0 refills | Status: DC
  Filled 2023-01-02: qty 90, 90d supply, fill #0

## 2023-01-02 MED ORDER — ROSUVASTATIN CALCIUM 10 MG PO TABS
10.0000 mg | ORAL_TABLET | Freq: Every day | ORAL | 0 refills | Status: DC
  Filled 2023-01-02: qty 90, 90d supply, fill #0

## 2023-01-03 ENCOUNTER — Other Ambulatory Visit (HOSPITAL_COMMUNITY): Payer: Self-pay

## 2023-01-05 ENCOUNTER — Other Ambulatory Visit: Payer: Self-pay

## 2023-02-09 ENCOUNTER — Ambulatory Visit: Payer: No Typology Code available for payment source | Admitting: Family Medicine

## 2023-02-16 ENCOUNTER — Ambulatory Visit: Payer: 59 | Admitting: Family Medicine

## 2023-02-16 ENCOUNTER — Other Ambulatory Visit: Payer: Self-pay

## 2023-02-17 ENCOUNTER — Other Ambulatory Visit: Payer: Self-pay

## 2023-03-24 LAB — COMPREHENSIVE METABOLIC PANEL
Albumin: 3.6 (ref 3.5–5.0)
Calcium: 8.5 — AB (ref 8.7–10.7)
Globulin: 2.6
eGFR: 72

## 2023-03-24 LAB — BASIC METABOLIC PANEL
BUN: 12 (ref 4–21)
CO2: 24 — AB (ref 13–22)
Chloride: 103 (ref 99–108)
Creatinine: 1.1 (ref 0.6–1.3)
Glucose: 95
Potassium: 3.7 mEq/L (ref 3.5–5.1)
Sodium: 138 (ref 137–147)

## 2023-03-24 LAB — HEPATIC FUNCTION PANEL
ALT: 51 U/L — AB (ref 10–40)
AST: 104 — AB (ref 14–40)
Bilirubin, Total: 0.7

## 2023-03-24 LAB — CBC AND DIFFERENTIAL
HCT: 41 (ref 41–53)
Hemoglobin: 14 (ref 13.5–17.5)
WBC: 11.5

## 2023-03-24 LAB — HEMOGLOBIN A1C: Hemoglobin A1C: 5.5

## 2023-03-24 LAB — CBC: RBC: 4.47 (ref 3.87–5.11)

## 2023-03-24 LAB — LIPID PANEL
Cholesterol: 239 — AB (ref 0–200)
HDL: 50 (ref 35–70)
LDL Cholesterol: 172
LDl/HDL Ratio: 5
Triglycerides: 85 (ref 40–160)

## 2023-04-02 ENCOUNTER — Other Ambulatory Visit: Payer: Self-pay | Admitting: Family Medicine

## 2023-04-02 DIAGNOSIS — I10 Essential (primary) hypertension: Secondary | ICD-10-CM

## 2023-04-02 DIAGNOSIS — E782 Mixed hyperlipidemia: Secondary | ICD-10-CM

## 2023-04-03 ENCOUNTER — Other Ambulatory Visit: Payer: Self-pay

## 2023-04-03 MED ORDER — LISINOPRIL-HYDROCHLOROTHIAZIDE 20-25 MG PO TABS
1.0000 | ORAL_TABLET | Freq: Every day | ORAL | 0 refills | Status: DC
  Filled 2023-04-03: qty 90, 90d supply, fill #0

## 2023-04-03 MED ORDER — ROSUVASTATIN CALCIUM 10 MG PO TABS
10.0000 mg | ORAL_TABLET | Freq: Every day | ORAL | 1 refills | Status: DC
  Filled 2023-04-03: qty 90, 90d supply, fill #0
  Filled 2023-06-16 – 2023-06-17 (×2): qty 90, 90d supply, fill #1

## 2023-04-06 ENCOUNTER — Other Ambulatory Visit: Payer: Self-pay

## 2023-04-06 ENCOUNTER — Encounter: Payer: Self-pay | Admitting: Family Medicine

## 2023-04-06 ENCOUNTER — Ambulatory Visit (INDEPENDENT_AMBULATORY_CARE_PROVIDER_SITE_OTHER): Payer: 59 | Admitting: Family Medicine

## 2023-04-06 VITALS — BP 130/83 | HR 68 | Ht 70.0 in | Wt 209.0 lb

## 2023-04-06 DIAGNOSIS — R7303 Prediabetes: Secondary | ICD-10-CM | POA: Diagnosis not present

## 2023-04-06 DIAGNOSIS — F17210 Nicotine dependence, cigarettes, uncomplicated: Secondary | ICD-10-CM | POA: Diagnosis not present

## 2023-04-06 DIAGNOSIS — L989 Disorder of the skin and subcutaneous tissue, unspecified: Secondary | ICD-10-CM | POA: Diagnosis not present

## 2023-04-06 DIAGNOSIS — E782 Mixed hyperlipidemia: Secondary | ICD-10-CM | POA: Diagnosis not present

## 2023-04-06 DIAGNOSIS — I10 Essential (primary) hypertension: Secondary | ICD-10-CM | POA: Diagnosis not present

## 2023-04-06 LAB — POCT GLYCOSYLATED HEMOGLOBIN (HGB A1C): HbA1c, POC (prediabetic range): 6.4 % (ref 5.7–6.4)

## 2023-04-06 MED ORDER — NICOTINE 21 MG/24HR TD PT24
21.0000 mg | MEDICATED_PATCH | Freq: Every day | TRANSDERMAL | 0 refills | Status: DC
  Filled 2023-04-06: qty 28, 28d supply, fill #0

## 2023-04-06 MED ORDER — NICOTINE 14 MG/24HR TD PT24
14.0000 mg | MEDICATED_PATCH | Freq: Every day | TRANSDERMAL | 0 refills | Status: DC
  Filled 2023-04-06: qty 28, 28d supply, fill #0

## 2023-04-06 MED ORDER — NICOTINE 7 MG/24HR TD PT24
7.0000 mg | MEDICATED_PATCH | Freq: Every day | TRANSDERMAL | 0 refills | Status: DC
  Filled 2023-04-06: qty 28, 28d supply, fill #0

## 2023-04-06 NOTE — Assessment & Plan Note (Signed)
A1c improved to 6.4%.  Encouraged to continue to work on dietary changes.

## 2023-04-06 NOTE — Assessment & Plan Note (Signed)
BP remains well controlled.  Continue with lisinopril/hctz at current strength.  Smoking cessation encouraged.

## 2023-04-06 NOTE — Assessment & Plan Note (Signed)
Actinic keratosis x 2 as well as verruca lesion, likely verruca skin tag on left cheek treated with liquid nitrogen.  Tolerated well.

## 2023-04-06 NOTE — Patient Instructions (Addendum)
Try patches to help with quitting smoking.  See me again in 6 months.  We'll do fasting labs at your next visit.

## 2023-04-06 NOTE — Assessment & Plan Note (Signed)
Adding nicoderm patches, taper over 3 months.

## 2023-04-06 NOTE — Progress Notes (Signed)
Cody Flores - 57 y.o. male MRN [id number]  Date of birth: July 13, 1966  Subjective Chief Complaint  Patient presents with   Hypertension    HPI Cody Flores is a 57 y.o. male here today for follow up visit.   He reports that he is doing pretty well.  He has a few skin lesion that he is concerned about. 2 areas on face and 1 on the L knee.  He tends to pick at these areas.   Continues on lisinopril/hctz for management of HTN.  BP is pretty well controlled with this.  Denies side effects at this time. He has not had chest pain, shortness of breath, palpitations, headache or vision changes.  He does continue to smoke.  Some success with cutting back using nicoderm patches.    A1c is increased to 7.1% at last visit.  He was prescribed metformin at last visit but is not taking this because it made him feel rundown.  He has been making changes to his diet.   Remains on crestor for HLD.  Tolerating this well at current strength.    ROS:  A comprehensive ROS was completed and negative except as noted per HPI  No Known Allergies    Past Medical History:  Diagnosis Date   Abdominal aortic aneurysm (AAA) (HCC) 05/31/2019   3 cm.  Unchanged November 2018   COPD (chronic obstructive pulmonary disease) (HCC)    Diverticulitis    Emphysema of lung (HCC)    GERD (gastroesophageal reflux disease)    History of colon polyps    HTN (hypertension) 03/05/2017    Past Surgical History:  Procedure Laterality Date   COLECTOMY  2013   Hospital in [place]    COLONOSCOPY  04/09/2012   [facility]    ESOPHAGOGASTRODUODENOSCOPY     over 20 years ago in [place]   LAPAROSCOPIC SIGMOID COLECTOMY     VASECTOMY      Social History   Socioeconomic History   Marital status: Married    Spouse name: Not on file   Number of children: Not on file   Years of education: Not on file   Highest education level: Not on file  Occupational History   Not on file   Tobacco Use   Smoking status: Every Day    Packs/day: 1.5    Types: Cigarettes   Smokeless tobacco: Never  Vaping Use   Vaping Use: Never used  Substance and Sexual Activity   Alcohol use: Yes    Alcohol/week: 0.0 standard drinks of alcohol    Comment: ocassionally/rare   Drug use: No   Sexual activity: Not on file  Other Topics Concern   Not on file  Social History Narrative   ** Merged History Encounter **       Social Determinants of Health   Financial Resource Strain: Not on file  Food Insecurity: Not on file  Transportation Needs: Not on file  Physical Activity: Not on file  Stress: Not on file  Social Connections: Not on file    Family History  Problem Relation Age of Onset   Aneurysm Father    Colon cancer Father        said tumor was removed    Diabetes Maternal Grandmother    Esophageal cancer Neg Hx    Colon polyps Neg Hx    Stomach cancer Neg Hx    Rectal cancer Neg Hx     Health Maintenance  Topic Date Due  COVID-19 Vaccine (3 - Moderna risk series) 07/18/2020   HIV Screening  08/05/2023 (Originally 01/12/1980)   INFLUENZA VACCINE  05/28/2023   Colonoscopy  05/06/2024   DTaP/Tdap/Td (2 - Td or Tdap) 01/02/2026   Hepatitis C Screening  Completed   Zoster Vaccines- Shingrix  Completed   HPV VACCINES  Aged Out     ----------------------------------------------------------------------------------------------------------------------------------------------------------------------------------------------------------------- Physical Exam BP 130/83 (BP Location: Left Arm, Patient Position: Sitting, Cuff Size: Normal)   Pulse 68   Ht 5\' 10"  (1.778 m)   Wt 209 lb (94.8 kg)   SpO2 96%   BMI 29.99 kg/m   Physical Exam Constitutional:      Appearance: Normal appearance.  HENT:     Head: Normocephalic and atraumatic.  Eyes:     General: No scleral icterus. Cardiovascular:     Rate and Rhythm: Normal rate and regular rhythm.  Pulmonary:      Effort: Pulmonary effort is normal.     Breath sounds: Normal breath sounds.  Skin:    Comments: Verrucous lesion to L cheek.   AK above L eyebrow.   AK of L knee.   Neurological:     General: No focal deficit present.     Mental Status: He is alert.  Psychiatric:        Mood and Affect: Mood normal.        Behavior: Behavior normal.    Procedure note: Cryo to 1 lesion on cheek.  2 cycles cryo completed.  2 mm Frost release ankle.  Tolerated well.  Actinic keratosis on the forehead as well as on the left knee treated with 2 cycles of cryo.  2 mm Frost ring relief cycle achieved.  He tolerated this well.  Postprocedure instructions were given.   ------------------------------------------------------------------------------------------------------------------------------------------------------------------------------------------------------------------- Assessment and Plan  HTN (hypertension) BP remains well controlled.  Continue with lisinopril/hctz at current strength.  Smoking cessation encouraged.   Nicotine dependence Adding nicoderm patches, taper over 3 months.   HLD (hyperlipidemia) Tolerating rosuvastatin at current strength.    Prediabetes A1c improved to 6.4%.  Encouraged to continue to work on dietary changes.    Skin lesion Actinic keratosis x 2 as well as verruca lesion, likely verruca skin tag on left cheek treated with liquid nitrogen.  Tolerated well.   Meds ordered this encounter  Medications   nicotine (NICODERM CQ) 14 mg/24hr patch    Sig: Place 1 patch (14 mg total) onto the skin daily. Reduce to 7 mg after 28 days.    Dispense:  28 patch    Refill:  0   nicotine (NICODERM CQ) 21 mg/24hr patch    Sig: Place 1 patch (21 mg total) onto the skin daily. Reduce to 14mg  after 28 days.    Dispense:  28 patch    Refill:  0   nicotine (NICODERM CQ) 7 mg/24hr patch    Sig: Place 1 patch (7 mg total) onto the skin daily.    Dispense:  28 patch    Refill:  0     Return in about 6 months (around 10/06/2023) for Annual exam/Fasting labs. .    This visit occurred during the SARS-CoV-2 public health emergency.  Safety protocols were in place, including screening questions prior to the visit, additional usage of staff PPE, and extensive cleaning of exam room while observing appropriate contact time as indicated for disinfecting solutions.

## 2023-04-06 NOTE — Assessment & Plan Note (Signed)
Tolerating rosuvastatin at current strength.

## 2023-04-07 ENCOUNTER — Other Ambulatory Visit (HOSPITAL_COMMUNITY): Payer: Self-pay

## 2023-05-14 ENCOUNTER — Other Ambulatory Visit: Payer: Self-pay

## 2023-05-14 ENCOUNTER — Other Ambulatory Visit (HOSPITAL_COMMUNITY): Payer: Self-pay

## 2023-06-16 ENCOUNTER — Other Ambulatory Visit: Payer: Self-pay | Admitting: Family Medicine

## 2023-06-16 ENCOUNTER — Other Ambulatory Visit (HOSPITAL_COMMUNITY): Payer: Self-pay

## 2023-06-16 DIAGNOSIS — I10 Essential (primary) hypertension: Secondary | ICD-10-CM

## 2023-06-16 MED ORDER — LISINOPRIL-HYDROCHLOROTHIAZIDE 20-25 MG PO TABS
1.0000 | ORAL_TABLET | Freq: Every day | ORAL | 0 refills | Status: DC
  Filled 2023-06-16 – 2023-06-17 (×2): qty 90, 90d supply, fill #0

## 2023-06-17 ENCOUNTER — Other Ambulatory Visit: Payer: Self-pay

## 2023-08-11 LAB — HM DIABETES EYE EXAM

## 2023-08-25 ENCOUNTER — Other Ambulatory Visit (HOSPITAL_COMMUNITY): Payer: Self-pay

## 2023-08-25 ENCOUNTER — Other Ambulatory Visit: Payer: Self-pay | Admitting: Family Medicine

## 2023-08-25 MED ORDER — TRIAMCINOLONE ACETONIDE 0.1 % EX CREA
1.0000 | TOPICAL_CREAM | Freq: Two times a day (BID) | CUTANEOUS | 0 refills | Status: AC
  Filled 2023-08-25: qty 160, 80d supply, fill #0
  Filled 2023-12-09: qty 160, 80d supply, fill #1
  Filled 2024-06-17: qty 90, 45d supply, fill #2

## 2023-08-25 MED ORDER — ALBUTEROL SULFATE HFA 108 (90 BASE) MCG/ACT IN AERS
2.0000 | INHALATION_SPRAY | Freq: Four times a day (QID) | RESPIRATORY_TRACT | 11 refills | Status: DC | PRN
  Filled 2023-08-25: qty 6.7, 25d supply, fill #0
  Filled 2023-12-09: qty 6.7, 25d supply, fill #1
  Filled 2024-02-24: qty 6.7, 25d supply, fill #2
  Filled 2024-06-17: qty 6.7, 25d supply, fill #3

## 2023-10-01 ENCOUNTER — Other Ambulatory Visit: Payer: Self-pay | Admitting: Family Medicine

## 2023-10-01 ENCOUNTER — Other Ambulatory Visit: Payer: Self-pay

## 2023-10-01 DIAGNOSIS — I10 Essential (primary) hypertension: Secondary | ICD-10-CM

## 2023-10-01 DIAGNOSIS — E782 Mixed hyperlipidemia: Secondary | ICD-10-CM

## 2023-10-01 MED ORDER — ROSUVASTATIN CALCIUM 10 MG PO TABS
10.0000 mg | ORAL_TABLET | Freq: Every day | ORAL | 1 refills | Status: DC
  Filled 2023-10-01: qty 90, 90d supply, fill #0
  Filled 2023-12-21: qty 90, 90d supply, fill #1

## 2023-10-01 MED ORDER — LISINOPRIL-HYDROCHLOROTHIAZIDE 20-25 MG PO TABS
1.0000 | ORAL_TABLET | Freq: Every day | ORAL | 0 refills | Status: DC
  Filled 2023-10-01: qty 90, 90d supply, fill #0

## 2023-10-19 ENCOUNTER — Ambulatory Visit (INDEPENDENT_AMBULATORY_CARE_PROVIDER_SITE_OTHER): Payer: 59 | Admitting: Family Medicine

## 2023-10-19 ENCOUNTER — Other Ambulatory Visit: Payer: Self-pay | Admitting: Family Medicine

## 2023-10-19 ENCOUNTER — Ambulatory Visit: Payer: 59

## 2023-10-19 ENCOUNTER — Encounter: Payer: Self-pay | Admitting: Family Medicine

## 2023-10-19 VITALS — BP 135/75 | HR 72 | Ht 70.0 in | Wt 215.0 lb

## 2023-10-19 DIAGNOSIS — E782 Mixed hyperlipidemia: Secondary | ICD-10-CM

## 2023-10-19 DIAGNOSIS — I7143 Infrarenal abdominal aortic aneurysm, without rupture: Secondary | ICD-10-CM

## 2023-10-19 DIAGNOSIS — R0609 Other forms of dyspnea: Secondary | ICD-10-CM | POA: Diagnosis not present

## 2023-10-19 DIAGNOSIS — R0602 Shortness of breath: Secondary | ICD-10-CM

## 2023-10-19 DIAGNOSIS — R011 Cardiac murmur, unspecified: Secondary | ICD-10-CM

## 2023-10-19 DIAGNOSIS — R7303 Prediabetes: Secondary | ICD-10-CM

## 2023-10-19 DIAGNOSIS — I77811 Abdominal aortic ectasia: Secondary | ICD-10-CM

## 2023-10-19 NOTE — Patient Instructions (Signed)
Referral to [facility] in [place] or [place].  Take 81mg  aspirin daily.

## 2023-10-19 NOTE — Progress Notes (Signed)
Cody Flores - 57 y.o. male MRN [id number]  Date of birth: [birth year]  Subjective Chief Complaint  Patient presents with   Shortness of Breath   Chest Pain   Annual Exam    HPI Cody Flores is a 57 y.o. male here today for follow up visit.   He reports that he is doing ok..   Continues on lisinopril/hydrochlorothiazide.  He is tolerating this well.  BP is well controlled.  He has had some exertional dyspnea.  He does have some tightness in his chest at times.  Pain does not radiate.    Crestor added previously.  He is taking this and tolerating well.  Due for updated lipid panel.   History of prediabetes.  A1c briefly above 6.5% but has been able to correct this with diet and lifestyle change. . Denies symptoms related to hyperglycemia.    ROS:  A comprehensive ROS was completed and negative except as noted per HPI  No Known Allergies  Past Medical History:  Diagnosis Date   Abdominal aortic aneurysm (AAA) (HCC) 05/31/2019   3 cm.  Unchanged November 2018   COPD (chronic obstructive pulmonary disease) (HCC)    Diverticulitis    Emphysema of lung (HCC)    GERD (gastroesophageal reflux disease)    History of colon polyps    HTN (hypertension) 03/05/2017    Past Surgical History:  Procedure Laterality Date   COLECTOMY  2013   Hospital in [place]    COLONOSCOPY  04/09/2012   [facility]    ESOPHAGOGASTRODUODENOSCOPY     over 20 years ago in [place]   LAPAROSCOPIC SIGMOID COLECTOMY     VASECTOMY      Social History   Socioeconomic History   Marital status: Married    Spouse name: Not on file   Number of children: Not on file   Years of education: Not on file   Highest education level: Not on file  Occupational History   Not on file  Tobacco Use   Smoking status: Every Day    Current packs/day: 1.50    Types: Cigarettes   Smokeless tobacco: Never  Vaping Use   Vaping status: Never Used  Substance and Sexual Activity    Alcohol use: Yes    Alcohol/week: 0.0 standard drinks of alcohol    Comment: ocassionally/rare   Drug use: No   Sexual activity: Not on file  Other Topics Concern   Not on file  Social History Narrative   ** Merged History Encounter **       Social Drivers of Health   Financial Resource Strain: Not on file  Food Insecurity: Not on file  Transportation Needs: Not on file  Physical Activity: Not on file  Stress: Not on file  Social Connections: Not on file    Family History  Problem Relation Age of Onset   Aneurysm Father    Colon cancer Father        said tumor was removed    Diabetes Maternal Grandmother    Esophageal cancer Neg Hx    Colon polyps Neg Hx    Stomach cancer Neg Hx    Rectal cancer Neg Hx     Health Maintenance  Topic Date Due   INFLUENZA VACCINE  01/25/2024 (Originally 05/28/2023)   COVID-19 Vaccine (3 - Moderna risk series) 01/25/2024 (Originally 07/18/2020)   HIV Screening  10/18/2024 (Originally 01/12/1980)   Colonoscopy  05/06/2024   DTaP/Tdap/Td (2 - Td or Tdap) 01/02/2026  Hepatitis C Screening  Completed   Zoster Vaccines- Shingrix  Completed   HPV VACCINES  Aged Out     ----------------------------------------------------------------------------------------------------------------------------------------------------------------------------------------------------------------- Physical Exam BP 135/75 (BP Location: Left Arm, Patient Position: Sitting, Cuff Size: Normal)   Pulse 72   Ht 5\' 10"  (1.778 m)   Wt 215 lb (97.5 kg)   SpO2 94%   BMI 30.85 kg/m   Physical Exam Constitutional:      Appearance: Normal appearance.  Eyes:     General: No scleral icterus. Cardiovascular:     Rate and Rhythm: Normal rate and regular rhythm.     Comments: Pansystolic murmur.  Pulmonary:     Effort: Pulmonary effort is normal.     Breath sounds: Normal breath sounds.  Neurological:     Mental Status: He is alert.  Psychiatric:        Mood and  Affect: Mood normal.        Behavior: Behavior normal.    EKG: NSR.  Cannot r/o age indeterminate anterior infarct.  ------------------------------------------------------------------------------------------------------------------------------------------------------------------------------------------------------------------- Assessment and Plan  Shortness of breath He is having exertional dyspnea with intermittent chest tightness.  Concerns for angina with history of HTN, HLD, borderline diabetes and tobacco use.  EKG without acute changes. Recommend 81mg  asa daily.  Continue statin.  Update lipid panel.  Checking BNP today.  Repeat Echo ordered due to murmur.  Referral to cardiology entered. Red flags reviewed.    HLD (hyperlipidemia) Update lipid panel   Prediabetes Updating A1c today.   Abdominal aortic aneurysm (AAA) (HCC) Updated [place] ordered today.    No orders of the defined types were placed in this encounter.   No follow-ups on file.    This visit occurred during the SARS-CoV-2 public health emergency.  Safety protocols were in place, including screening questions prior to the visit, additional usage of staff PPE, and extensive cleaning of exam room while observing appropriate contact time as indicated for disinfecting solutions.

## 2023-10-19 NOTE — Assessment & Plan Note (Signed)
Updated [place] ordered today.

## 2023-10-19 NOTE — Assessment & Plan Note (Signed)
Updating A1c today. ?

## 2023-10-19 NOTE — Assessment & Plan Note (Signed)
Update lipid panel.  

## 2023-10-19 NOTE — Assessment & Plan Note (Signed)
He is having exertional dyspnea with intermittent chest tightness.  Concerns for angina with history of HTN, HLD, borderline diabetes and tobacco use.  EKG without acute changes. Recommend 81mg  asa daily.  Continue statin.  Update lipid panel.  Checking BNP today.  Repeat Echo ordered due to murmur.  Referral to cardiology entered. Red flags reviewed.

## 2023-10-20 LAB — LIPID PANEL WITH LDL/HDL RATIO
Cholesterol, Total: 153 mg/dL (ref 100–199)
HDL: 41 mg/dL (ref >39)
LDL Chol Calc (NIH): 88 mg/dL (ref 0–99)
LDL/HDL Ratio: 2.1 {ratio} (ref 0.0–3.6)
Triglycerides: 133 mg/dL (ref 0–149)
VLDL Cholesterol Cal: 24 mg/dL (ref 5–40)

## 2023-10-20 LAB — CMP14+EGFR
ALT: 26 [IU]/L (ref 0–44)
AST: 19 [IU]/L (ref 0–40)
Albumin: 4.8 g/dL (ref 3.8–4.9)
Alkaline Phosphatase: 80 [IU]/L (ref 44–121)
BUN/Creatinine Ratio: 21 — ABNORMAL HIGH (ref 9–20)
BUN: 22 mg/dL (ref 6–24)
Bilirubin Total: 0.3 mg/dL (ref 0.0–1.2)
CO2: 25 mmol/L (ref 20–29)
Calcium: 10.1 mg/dL (ref 8.7–10.2)
Chloride: 92 mmol/L — ABNORMAL LOW (ref 96–106)
Creatinine, Ser: 1.04 mg/dL (ref 0.76–1.27)
Globulin, Total: 2.5 g/dL (ref 1.5–4.5)
Glucose: 264 mg/dL — ABNORMAL HIGH (ref 70–99)
Potassium: 4.9 mmol/L (ref 3.5–5.2)
Sodium: 131 mmol/L — ABNORMAL LOW (ref 134–144)
Total Protein: 7.3 g/dL (ref 6.0–8.5)
eGFR: 83 mL/min/{1.73_m2} (ref >59)

## 2023-10-20 LAB — HEMOGLOBIN A1C
Est. average glucose Bld gHb Est-mCnc: 237 mg/dL
Hgb A1c MFr Bld: 9.9 % — ABNORMAL HIGH (ref 4.8–5.6)

## 2023-10-20 LAB — BRAIN NATRIURETIC PEPTIDE: BNP: 13.7 pg/mL (ref 0.0–100.0)

## 2023-10-23 ENCOUNTER — Other Ambulatory Visit: Payer: Self-pay | Admitting: Family Medicine

## 2023-10-23 ENCOUNTER — Encounter: Payer: Self-pay | Admitting: Family Medicine

## 2023-10-23 ENCOUNTER — Other Ambulatory Visit: Payer: Self-pay

## 2023-10-23 ENCOUNTER — Other Ambulatory Visit (HOSPITAL_COMMUNITY): Payer: Self-pay

## 2023-10-23 MED ORDER — RYBELSUS 3 MG PO TABS
3.0000 mg | ORAL_TABLET | Freq: Every day | ORAL | 0 refills | Status: DC
Start: 2023-10-23 — End: 2024-03-03
  Filled 2023-10-23 – 2024-01-27 (×3): qty 30, 30d supply, fill #0

## 2023-10-23 MED ORDER — RYBELSUS 7 MG PO TABS
7.0000 mg | ORAL_TABLET | Freq: Every day | ORAL | 0 refills | Status: DC
  Filled 2023-10-23 – 2023-10-27 (×2): qty 30, 30d supply, fill #0
  Filled 2023-12-21: qty 90, 90d supply, fill #0
  Filled 2024-02-24: qty 30, 30d supply, fill #0

## 2023-10-23 NOTE — Telephone Encounter (Signed)
Rybelsus sent to pharmacy today.  Message forwarded to DR. Ashley Royalty to review as well.

## 2023-10-26 NOTE — Addendum Note (Signed)
Addended by: Chalmers Cater on: 10/26/2023 02:07 PM   Modules accepted: Orders

## 2023-10-27 ENCOUNTER — Other Ambulatory Visit (HOSPITAL_COMMUNITY): Payer: Self-pay

## 2023-10-27 ENCOUNTER — Other Ambulatory Visit: Payer: Self-pay

## 2023-11-17 ENCOUNTER — Other Ambulatory Visit (HOSPITAL_COMMUNITY): Payer: Self-pay

## 2023-12-09 ENCOUNTER — Other Ambulatory Visit: Payer: Self-pay

## 2023-12-09 ENCOUNTER — Other Ambulatory Visit (HOSPITAL_COMMUNITY): Payer: Self-pay

## 2023-12-11 ENCOUNTER — Other Ambulatory Visit (HOSPITAL_COMMUNITY): Payer: Self-pay

## 2023-12-11 ENCOUNTER — Other Ambulatory Visit: Payer: Self-pay

## 2023-12-21 ENCOUNTER — Other Ambulatory Visit: Payer: Self-pay

## 2023-12-21 ENCOUNTER — Other Ambulatory Visit (HOSPITAL_COMMUNITY): Payer: Self-pay

## 2023-12-21 ENCOUNTER — Other Ambulatory Visit: Payer: Self-pay | Admitting: Family Medicine

## 2023-12-21 DIAGNOSIS — I10 Essential (primary) hypertension: Secondary | ICD-10-CM

## 2023-12-21 MED ORDER — LISINOPRIL-HYDROCHLOROTHIAZIDE 20-25 MG PO TABS
1.0000 | ORAL_TABLET | Freq: Every day | ORAL | 1 refills | Status: DC
  Filled 2023-12-21: qty 90, 90d supply, fill #0
  Filled 2024-02-24 – 2024-03-10 (×3): qty 90, 90d supply, fill #1

## 2023-12-22 ENCOUNTER — Other Ambulatory Visit (HOSPITAL_COMMUNITY): Payer: Self-pay

## 2023-12-28 ENCOUNTER — Ambulatory Visit (HOSPITAL_COMMUNITY): Payer: No Typology Code available for payment source

## 2024-01-11 ENCOUNTER — Other Ambulatory Visit: Payer: Self-pay

## 2024-01-11 ENCOUNTER — Ambulatory Visit: Admitting: Cardiovascular Disease

## 2024-01-11 ENCOUNTER — Other Ambulatory Visit (HOSPITAL_COMMUNITY): Payer: Self-pay

## 2024-01-11 ENCOUNTER — Encounter: Payer: Self-pay | Admitting: Cardiovascular Disease

## 2024-01-11 ENCOUNTER — Ambulatory Visit (HOSPITAL_COMMUNITY): Payer: No Typology Code available for payment source | Attending: Cardiology

## 2024-01-11 VITALS — BP 138/82 | HR 78 | Ht 70.0 in | Wt 206.4 lb

## 2024-01-11 DIAGNOSIS — I421 Obstructive hypertrophic cardiomyopathy: Secondary | ICD-10-CM

## 2024-01-11 DIAGNOSIS — Z01810 Encounter for preprocedural cardiovascular examination: Secondary | ICD-10-CM

## 2024-01-11 DIAGNOSIS — R011 Cardiac murmur, unspecified: Secondary | ICD-10-CM | POA: Diagnosis not present

## 2024-01-11 DIAGNOSIS — R0602 Shortness of breath: Secondary | ICD-10-CM | POA: Insufficient documentation

## 2024-01-11 LAB — ECHOCARDIOGRAM COMPLETE
Area-P 1/2: 3.77 cm2
S' Lateral: 2.45 cm

## 2024-01-11 MED ORDER — METOPROLOL TARTRATE 25 MG PO TABS
25.0000 mg | ORAL_TABLET | Freq: Two times a day (BID) | ORAL | 3 refills | Status: DC
  Filled 2024-01-11: qty 180, 90d supply, fill #0

## 2024-01-11 NOTE — Progress Notes (Signed)
  Cardiology Office Note:  .   Date:  01/11/2024  ID:  Cody Flores, DOB [birth year], MRN [id number] PCP: Everrett Coombe, DO  [facility] Providers Cardiologist:  None    History of Present Illness: .   Cody Flores is a 58 y.o. male with progressive DOE.  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   Advised cessation      ROS:   Studies Reviewed: .         Risk Assessment/Calculations:             Physical Exam:   VS:  BP 138/82 (BP Location: Right Arm, Patient Position: Sitting, Cuff Size: Large)   Pulse 78   Ht 5\' 10"  (1.778 m)   Wt 206 lb 6.4 oz (93.6 kg)   SpO2 96%   BMI 29.62 kg/m    Wt Readings from Last 3 Encounters:  01/11/24 206 lb 6.4 oz (93.6 kg)  10/19/23 215 lb (97.5 kg)  04/06/23 209 lb (94.8 kg)    GEN: Well nourished, well developed in no acute distress NECK: No JVD; No carotid bruits CARDIAC: RR,  2/6 systolic murmur radiating to the axilla   RESPIRATORY:  Clear to auscultation without rales, wheezing or rhonchi  ABDOMEN: Soft, non-tender, non-distended EXTREMITIES:  No edema; No deformity   ASSESSMENT AND PLAN: .   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  The obstruction worsens when he goes from sitting to standing.  Upon standing his outflow tract gradient increased to 72 mmHg.  I like to start with metoprolol 25 mg twice a day. Will get a cardiac MRI.  I would like to refer him to Dr. Izora Ribas further evaluation and management of his HOCM.  We want to avoid volume depletion.  He is currently on lisinopril HCTZ.  If he becomes hypotensive I would like to discontinue the HCTZ component of that medication.  We will likely be able  to continue the lisinopril component.  He is to take his blood pressure daily and keep a blood pressure log.   I recommended that he stop smoking.   2.  Hyperlipidemia: Continue rosuvastatin 10 mg a day.        Dispo: several months with Dr. Timoteo Gaul.     Signed, Kristeen Miss, MD

## 2024-01-11 NOTE — Patient Instructions (Signed)
 Medication Instructions:  START Metoprolol Tartrate 25mg  twice daily *If you need a refill on your cardiac medications before your next appointment, please call your pharmacy*  Lab Work: CBC today If you have labs (blood work) drawn today and your tests are completely normal, you will receive your results only by: MyChart Message (if you have MyChart) OR A paper copy in the mail If you have any lab test that is abnormal or we need to change your treatment, we will call you to review the results.  Testing/Procedures: Cardiac MRI Your physician has requested that you have a cardiac MRI. Cardiac MRI uses a computer to create images of your heart as its beating, producing both still and moving pictures of your heart and major blood vessels. For further information please visit [email or web address]. Please follow the instruction sheet given to you today for more information.  Follow-Up: At [facility], you and your health needs are our priority.  As part of our continuing mission to provide you with exceptional heart care, we have created designated Provider Care Teams.  These Care Teams include your primary Cardiologist (physician) and Advanced Practice Providers (APPs -  Physician Assistants and Nurse Practitioners) who all work together to provide you with the care you need, when you need it.  Your next appointment:   2 month(s)  Provider:   Riley Lam, MD  Other Instructions   [facility] [address] [place], [place] [address] Please take advantage of the free valet parking available at the [facility] and [employer] (Entrance C).  Proceed to the [facility] Radiology Department (First Floor) for check-in.   OR   [facility] [address] [place], [place] [address] Please go to the [facility] and check-in with the desk attendant.   Magnetic resonance imaging (MRI) is a painless test that produces images of the  inside of the body without using Xrays.  During an MRI, strong magnets and radio waves work together in a Data processing manager to form detailed images.   MRI images may provide more details about a medical condition than X-rays, CT scans, and ultrasounds can provide.  You may be given earphones to listen for instructions.  You may eat a light breakfast and take medications as ordered with the exception of furosemide, hydrochlorothiazide, chlorthalidone or spironolactone (or any other fluid pill). If you are undergoing a stress MRI, please avoid stimulants for 12 hr prior to test. (I.e. Caffeine, nicotine, chocolate, or antihistamine medications)  An IV will be inserted into one of your veins. Contrast material will be injected into your IV. It will leave your body through your urine within a day. You may be told to drink plenty of fluids to help flush the contrast material out of your system.  You will be asked to remove all metal, including: Watch, jewelry, and other metal objects including hearing aids, hair pieces and dentures. Also wearable glucose monitoring systems (ie. Freestyle Libre and Omnipods) (Braces and fillings normally are not a problem.)   TEST WILL TAKE APPROXIMATELY 1 HOUR  PLEASE NOTIFY SCHEDULING AT LEAST 24 HOURS IN ADVANCE IF YOU ARE UNABLE TO KEEP YOUR APPOINTMENT. [phone]  For more information and frequently asked questions, please visit our website : [email or web address]  Please call the Cardiac Imaging Nurse Navigators with any questions/concerns. [phone] Office

## 2024-01-12 ENCOUNTER — Encounter (HOSPITAL_COMMUNITY): Payer: Self-pay

## 2024-01-13 ENCOUNTER — Other Ambulatory Visit: Payer: Self-pay

## 2024-01-15 ENCOUNTER — Other Ambulatory Visit: Payer: Self-pay

## 2024-01-19 ENCOUNTER — Telehealth: Payer: Self-pay

## 2024-01-19 NOTE — Telephone Encounter (Signed)
 Pharmacy Patient Advocate Encounter   Received notification from Onbase that prior authorization for Rybelsus 7MG  tablets is required/requested.   Insurance verification completed.   The patient is insured through [facility] .   Per test claim: PA required; PA submitted to above mentioned insurance via CoverMyMeds Key/confirmation #/EOC [id number] Status is pending

## 2024-01-20 ENCOUNTER — Encounter: Payer: Self-pay | Admitting: Family Medicine

## 2024-01-20 DIAGNOSIS — E1165 Type 2 diabetes mellitus with hyperglycemia: Secondary | ICD-10-CM | POA: Insufficient documentation

## 2024-01-20 NOTE — Telephone Encounter (Signed)
 Pharmacy Patient Advocate Encounter  Received notification from [facility] that Prior Authorization for Rybelsus 7MG  tablets  has been DENIED.  Full denial letter will be uploaded to the media tab. See denial reason below.   PA #/Case ID/Reference #:  [id number]

## 2024-01-26 ENCOUNTER — Encounter: Payer: Self-pay | Admitting: Family Medicine

## 2024-01-26 ENCOUNTER — Other Ambulatory Visit (HOSPITAL_COMMUNITY): Payer: Self-pay

## 2024-01-26 ENCOUNTER — Encounter (INDEPENDENT_AMBULATORY_CARE_PROVIDER_SITE_OTHER): Payer: Self-pay | Admitting: Internal Medicine

## 2024-01-26 DIAGNOSIS — I421 Obstructive hypertrophic cardiomyopathy: Secondary | ICD-10-CM

## 2024-01-27 ENCOUNTER — Other Ambulatory Visit (HOSPITAL_COMMUNITY): Payer: Self-pay

## 2024-01-27 ENCOUNTER — Telehealth: Payer: Self-pay | Admitting: Pharmacist

## 2024-01-27 NOTE — Telephone Encounter (Signed)
 Appeal not needed for Rybelsus 3mg  tablets.  Per test claim, medication is covered for Type 2 diabetic:    I let the pharmacy know!  Thanks, Dellie Burns, PharmD Clinical Pharmacist  [place]  Direct Dial: [phone]

## 2024-01-27 NOTE — Telephone Encounter (Signed)
 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   In interval, stated on beta blockade.  Asks questions about exercise. - no hx of SCD or Fhx of SCD documented - no prior stress testing documented. - no syncopal episodes  Reasonable for mild exercise, Formal eval pending IF worsening symptoms with exercise, stop hydrochlorothiazide, stop smoking, and f/u BP  Please see the MyChart message reply(ies) for my assessment and plan.    This patient gave consent for this Medical Advice Message and is aware that it may result in a bill to [employer], as well as the possibility of receiving a bill for a co-payment or deductible. They are an established patient, but are not seeking medical advice exclusively about a problem treated during an in person or video visit in the last seven days. I did not recommend an in person or video visit within seven days of my reply.    I spent a total of 10 minutes cumulative time within 7 days through [employer].  Christell Constant, MD

## 2024-01-28 ENCOUNTER — Other Ambulatory Visit (HOSPITAL_COMMUNITY): Payer: Self-pay

## 2024-02-03 ENCOUNTER — Other Ambulatory Visit (HOSPITAL_COMMUNITY): Payer: Self-pay

## 2024-02-03 DIAGNOSIS — I421 Obstructive hypertrophic cardiomyopathy: Secondary | ICD-10-CM | POA: Diagnosis not present

## 2024-02-03 DIAGNOSIS — Z01810 Encounter for preprocedural cardiovascular examination: Secondary | ICD-10-CM | POA: Diagnosis not present

## 2024-02-03 LAB — CBC
Hematocrit: 46.3 % (ref 37.5–51.0)
Hemoglobin: 14.8 g/dL (ref 13.0–17.7)
MCH: 27.5 pg (ref 26.6–33.0)
MCHC: 32 g/dL (ref 31.5–35.7)
MCV: 86 fL (ref 79–97)
Platelets: 247 10*3/uL (ref 150–450)
RBC: 5.38 x10E6/uL (ref 4.14–5.80)
RDW: 12.7 % (ref 11.6–15.4)
WBC: 7.6 10*3/uL (ref 3.4–10.8)

## 2024-02-04 ENCOUNTER — Encounter: Payer: Self-pay | Admitting: Cardiovascular Disease

## 2024-02-04 ENCOUNTER — Encounter (HOSPITAL_COMMUNITY): Payer: Self-pay

## 2024-02-09 ENCOUNTER — Encounter: Payer: Self-pay | Admitting: Cardiovascular Disease

## 2024-02-09 ENCOUNTER — Other Ambulatory Visit: Payer: Self-pay | Admitting: Cardiovascular Disease

## 2024-02-09 ENCOUNTER — Ambulatory Visit (HOSPITAL_COMMUNITY)
Admission: RE | Admit: 2024-02-09 | Discharge: 2024-02-09 | Disposition: A | Discharge status: Home / Self Care | Source: Ambulatory Visit | Attending: Cardiovascular Disease | Admitting: Cardiovascular Disease

## 2024-02-09 DIAGNOSIS — I421 Obstructive hypertrophic cardiomyopathy: Secondary | ICD-10-CM

## 2024-02-09 DIAGNOSIS — Z01810 Encounter for preprocedural cardiovascular examination: Secondary | ICD-10-CM

## 2024-02-09 MED ORDER — GADOBUTROL 1 MMOL/ML IV SOLN
10.0000 mL | Freq: Once | INTRAVENOUS | Status: AC | PRN
  Administered 2024-02-09: 10 mL via INTRAVENOUS

## 2024-02-22 NOTE — Telephone Encounter (Signed)
 This request has been handled. No further action is required.

## 2024-02-24 ENCOUNTER — Other Ambulatory Visit (HOSPITAL_COMMUNITY): Payer: Self-pay

## 2024-02-24 ENCOUNTER — Other Ambulatory Visit: Payer: Self-pay

## 2024-02-24 ENCOUNTER — Other Ambulatory Visit: Payer: Self-pay | Admitting: Family Medicine

## 2024-02-24 DIAGNOSIS — E782 Mixed hyperlipidemia: Secondary | ICD-10-CM

## 2024-02-24 MED ORDER — ROSUVASTATIN CALCIUM 10 MG PO TABS
10.0000 mg | ORAL_TABLET | Freq: Every day | ORAL | 2 refills | Status: AC
  Filled 2024-02-24 – 2024-03-10 (×3): qty 90, 90d supply, fill #0
  Filled 2024-06-17: qty 90, 90d supply, fill #1
  Filled 2024-09-14: qty 90, 90d supply, fill #2

## 2024-02-25 ENCOUNTER — Other Ambulatory Visit (HOSPITAL_COMMUNITY): Payer: Self-pay

## 2024-03-01 ENCOUNTER — Other Ambulatory Visit: Payer: Self-pay

## 2024-03-01 ENCOUNTER — Other Ambulatory Visit (HOSPITAL_COMMUNITY): Payer: Self-pay

## 2024-03-02 ENCOUNTER — Encounter: Payer: Self-pay | Admitting: Family Medicine

## 2024-03-02 ENCOUNTER — Other Ambulatory Visit: Payer: Self-pay | Admitting: Family Medicine

## 2024-03-03 ENCOUNTER — Other Ambulatory Visit: Payer: Self-pay

## 2024-03-03 ENCOUNTER — Other Ambulatory Visit (HOSPITAL_COMMUNITY): Payer: Self-pay

## 2024-03-03 MED ORDER — RYBELSUS 3 MG PO TABS
3.0000 mg | ORAL_TABLET | Freq: Every day | ORAL | 1 refills | Status: DC
  Filled 2024-03-03: qty 30, 30d supply, fill #0
  Filled 2024-04-19: qty 30, 30d supply, fill #1

## 2024-03-11 ENCOUNTER — Other Ambulatory Visit (HOSPITAL_COMMUNITY): Payer: Self-pay

## 2024-03-11 ENCOUNTER — Other Ambulatory Visit: Payer: Self-pay

## 2024-03-15 ENCOUNTER — Ambulatory Visit: Admitting: Genetic Counselor

## 2024-03-15 ENCOUNTER — Telehealth: Payer: Self-pay | Admitting: Pharmacist

## 2024-03-15 ENCOUNTER — Ambulatory Visit: Attending: Internal Medicine | Admitting: Internal Medicine

## 2024-03-15 ENCOUNTER — Encounter: Payer: Self-pay | Admitting: Internal Medicine

## 2024-03-15 ENCOUNTER — Ambulatory Visit

## 2024-03-15 VITALS — BP 110/70 | HR 62 | Wt 196.0 lb

## 2024-03-15 DIAGNOSIS — I421 Obstructive hypertrophic cardiomyopathy: Secondary | ICD-10-CM | POA: Diagnosis not present

## 2024-03-15 DIAGNOSIS — Z8241 Family history of sudden cardiac death: Secondary | ICD-10-CM

## 2024-03-15 DIAGNOSIS — R42 Dizziness and giddiness: Secondary | ICD-10-CM

## 2024-03-15 MED ORDER — MAVACAMTEN 5 MG PO CAPS: 5.0000 mg | ORAL_CAPSULE | Freq: Every day | ORAL | 0 refills | Status: DC

## 2024-03-15 NOTE — Progress Notes (Addendum)
 Cardiology Office Note:  .    Date:  03/15/2024  ID:  Augustin Leber, DOB [birth year], MRN [id number] PCP: Adela Holter, DO  [facility] Providers Cardiologist:  None     CC: Exercise intolerance Consulted for the evaluation of oHCM at the behest of Dr. Alroy Aspen  History of Present Illness: .    Cody Flores is a 58 y.o. male with severe obstructive hypertrophic cardiomyopathy who presents with symptoms management. He was referred by Dr. Floria Hurst for evaluation of hypertrophic obstructive cardiomyopathy.  He experiences shortness of breath with exertion, particularly when climbing stairs or engaging in physical activities such as yard work or gym exercises. Fatigue and feeling 'low winded' are noted during these activities. He works with a Systems analyst and finds the bike easier than stairs or treadmill exercises. He also experienced shortness of breath and wheezing during a vacation in January after walking a couple of miles.  He has long-standing chest pain attributed to acid reflux and denies significant chest pain during exertion. No episodes of syncope, but occasional lightheadedness upon standing are noted. He has experienced blurred vision over the last two weeks, which he associates with the medication. No episodes of passing out or significant dizziness.  His family history is significant for heart problems, including an uncle who died at 35 from a heart attack, a grandfather who died at 38 from a heart attack, and a grandmother who died of heart disease in her late 75s or early 81s. A cousin died in a car accident, with suspicion of a possible cardiac event preceding the accident. His brother has atrial fibrillation and obstructive sleep apnea.  He has a history of an abdominal aortic aneurysm, which has been monitored over time. A recent MRI showed mitral and tricuspid regurgitation.  Discussed the use of AI scribe software for clinical note transcription with the  patient, who gave verbal consent to proceed.  Relevant histories: .  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   Studies Reviewed: .     Cardiac Studies & Procedures   ______________________________________________________________________________________________     ECHOCARDIOGRAM  ECHOCARDIOGRAM COMPLETE 01/11/2024  Narrative ECHOCARDIOGRAM REPORT    Patient Name:   GAITHER BIEHN Date of Exam: 01/11/2024 Medical Rec #:  [id number]         Height:       70.0 in Accession #:    [id number]        Weight:       215.0 lb Date of Birth:  [birth year]         BSA:          2.152 m Patient Age:    57 years          BP:           115/80 mmHg Patient Gender: M                 HR:           63 bpm. Exam Location:  [address]  Procedure: 2D Echo, Cardiac Doppler, Color Doppler, 3D Echo and Strain Analysis (Both Spectral and Color Flow Doppler were utilized during procedure).  Indications:     R06.02 SOB; R06.9 DOE; R01.1 Murmur  History:         Patient has prior history of Echocardiogram examinations. COPD; Risk Factors:Hypertension.  Sonographer:     Joleen Navy RDCS Referring Phys:  [id number] CODY MATTHEWS Diagnosing Phys: Grady Lawman MD   Sonographer Comments: Squat to Stand Provocation protocol (Mayo protocol) Patient was added on to Dr. Alroy Aspen (DOD) schedule today. IMPRESSIONS   1. Dynamic maneuvers performed to elicit LVOT gradient. 2. At rest, LVOT max instantaneous gradient 12 mmHg. With Valsalva, max instantaneous gradient 25 mmHg (HR 67 bpm). 3. With simply standing from a seated position, max instantaneous gradient is 54 mmHg (HR 84 bpm). MR increases to mild. 4. Wit repetitive squat to stand, LVOT max instantaneous gradient 76 mmHg (HR 80 bpm). MR not well visualized, appears mild. 5. Left ventricular ejection fraction, by  estimation, is 70 to 75%. The left ventricle has hyperdynamic function. The left ventricle has no regional wall motion abnormalities. There is moderate asymmetric left ventricular hypertrophy of the basal-septal segment (14-15 mm). Left ventricular diastolic parameters are consistent with Grade I diastolic dysfunction (impaired relaxation). The average left ventricular global longitudinal strain is -19.9 %. The global longitudinal strain is normal. 6. Right ventricular systolic function is normal. The right ventricular size is normal. Tricuspid regurgitation signal is inadequate for assessing PA pressure. 7. The aortic valve is tricuspid. Aortic valve regurgitation is not visualized. No aortic stenosis is present. 8. Chordal SAM. The mitral valve is normal in structure. Trivial mitral valve regurgitation. No evidence of mitral stenosis. 9. The inferior vena cava is normal in size with greater than 50% respiratory variability, suggesting right atrial pressure of 3 mmHg.  FINDINGS Left Ventricle: Left ventricular ejection fraction, by estimation, is 70 to 75%. The left ventricle has hyperdynamic function. The left ventricle has no regional wall motion abnormalities. The average left ventricular global longitudinal strain is -19.9 %. Strain was performed and the global longitudinal strain is normal. 3D ejection fraction reviewed and evaluated as part of the interpretation. Alternate measurement of EF is felt to be most reflective of LV function. The left ventricular internal cavity size was normal in size. There is moderate asymmetric left ventricular hypertrophy of the basal-septal segment. Left ventricular diastolic parameters are consistent with Grade I diastolic dysfunction (impaired relaxation).  Right Ventricle: The right ventricular size is normal. No increase in right ventricular wall thickness. Right ventricular systolic function is normal. Tricuspid regurgitation signal is inadequate for assessing  PA pressure.  Left Atrium: Left atrial size was normal in size.  Right Atrium: Right atrial size was normal in size.  Pericardium: There is no evidence of pericardial effusion.  Mitral Valve: Chordal SAM. The mitral valve is normal in structure. Trivial mitral valve regurgitation. No evidence of mitral valve stenosis.  Tricuspid Valve: The tricuspid valve is grossly normal. Tricuspid valve regurgitation is trivial. No evidence of tricuspid stenosis.  Aortic Valve: The aortic valve is tricuspid. Aortic valve regurgitation is not visualized. No aortic stenosis is present.  Pulmonic Valve: The pulmonic valve was normal in structure. Pulmonic valve regurgitation is not visualized. No evidence of pulmonic stenosis.  Aorta: The aortic root is normal in size and structure.  Venous: The inferior vena cava is normal in size with greater than 50% respiratory variability, suggesting right atrial pressure of 3 mmHg.  IAS/Shunts: The interatrial septum was not well visualized.  Additional Comments: 3D was performed not requiring image post processing on an independent workstation and was indeterminate.   LEFT VENTRICLE PLAX 2D LVIDd:         4.35 cm   Diastology LVIDs:  2.45 cm   LV e' medial:    6.64 cm/s LV PW:         0.95 cm   LV E/e' medial:  13.2 LV IVS:        1.50 cm   LV e' lateral:   8.81 cm/s LVOT diam:     2.50 cm   LV E/e' lateral: 9.9 LV SV:         160 LV SV Index:   74        2D Longitudinal Strain LVOT Area:     4.91 cm  2D Strain GLS (A4C):   -20.5 % 2D Strain GLS (A3C):   -20.9 % 2D Strain GLS (A2C):   -18.4 % 2D Strain GLS Avg:     -19.9 %  3D Volume EF: 3D EF:        60 % LV EDV:       128 ml LV ESV:       51 ml LV SV:        77 ml  RIGHT VENTRICLE RV Basal diam:  3.90 cm RV Mid diam:    3.50 cm RV S prime:     14.10 cm/s TAPSE (M-mode): 2.8 cm  LEFT ATRIUM             Index        RIGHT ATRIUM           Index LA diam:        4.15 cm 1.93 cm/m   RA  Area:     12.80 cm LA Vol (A2C):   38.6 ml 17.93 ml/m  RA Volume:   26.20 ml  12.17 ml/m LA Vol (A4C):   31.9 ml 14.82 ml/m LA Biplane Vol: 35.3 ml 16.40 ml/m AORTIC VALVE LVOT Vmax:   159.00 cm/s LVOT Vmean:  107.000 cm/s LVOT VTI:    0.326 m  AORTA Ao Root diam: 3.40 cm Ao Asc diam:  3.70 cm  MITRAL VALVE MV Area (PHT): 3.77 cm    SHUNTS MV Decel Time: 201 msec    Systemic VTI:  0.33 m MV E velocity: 87.50 cm/s  Systemic Diam: 2.50 cm MV A velocity: 98.00 cm/s MV E/A ratio:  0.89  Grady Lawman MD Electronically signed by Grady Lawman MD Signature Date/Time: 01/11/2024/9:52:01 AM    Final (Updated)        CARDIAC MRI  MR CARDIAC MORPHOLOGY W WO CONTRAST 02/09/2024  Narrative CLINICAL DATA:  Cardiomyopathy, follow up  COMPARISON: Echo 01/11/24  EXAM: MR CARDIA MORPHOLOGY WITHOUT AND WITH CONTRAST; MR CARDIAC VELOCITY FLOW MAPPING  TECHNIQUE: The patient was scanned on a 1.5 Tesla Siemens magnet. A dedicated cardiac coil was used. Functional imaging was done using TrueFisp sequences. 2,3, and 4 chamber views were done to assess for RWMA's. Modified Simpson's rule using a short axis stack was used to calculate an ejection fraction on a dedicated work [employer]. The patient received 10mL GADAVIST  GADOBUTROL  1 MMOL/ML IV SOLN. After 10 minutes inversion recovery sequences were used to assess for infiltration and scar tissue. Phase contrast velocity encoded images obtained x 2.  This examination is tailored for evaluation cardiac anatomy and function and provides very limited assessment of noncardiac structures, which are accordingly not evaluated during interpretation. If there is clinical concern for extracardiac pathology, further evaluation with CT imaging should be considered.  FINDINGS: LEFT VENTRICLE:  Left ventricular chamber size: Normal.  Maximal wall thickness: 15 mm  Location: Basal septum  There is systolic  anterior motion of the mitral valve. Flow dephasing suggests left ventricular outflow tract obstruction. Mitral regurgitation is seen.  No evidence of left ventricular apical aneurysm.  Findings are consistent with hypertrophic cardiomyopathy with obstruction. Morphologic subtype: Sigmoid subtype  Normal left ventricular systolic function.  LVEF = 62%  There are no regional wall motion abnormalities.  No myocardial edema, T2 = 47 msec  Normal first pass perfusion.  There is no post contrast delayed myocardial enhancement.  Normal T1 myocardial nulling kinetics suggest against a diagnosis of cardiac amyloidosis.  ECV = 25%  RIGHT VENTRICLE:  Normal right ventricular chamber size.  Normal right ventricular wall thickness.  Normal right ventricular systolic function.  RVEF = 51%  There are no regional wall motion abnormalities.  No post contrast delayed myocardial enhancement.  ATRIA:  Grossly normal.  PERICARDIUM:  Normal pericardium.  No pericardial effusion.  OTHER: No significant extracardiac findings. 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 Grossly similar appearance across modalities.  MEASUREMENTS: Qp/Qs: 0.92  VALVES:  Aortic valve regurgitation: Trivial, regurgitant fraction 1%  Pulmonary valve regurgitation: Trivial, regurgitant fraction 3%  Mitral valve regurgitation: Mild, regurgitant fraction 17.5%  Tricuspid valve regurgitation: Mild-moderate, regurgitant fraction 20.5%  Left ventricle:  LV male  LV EF: 62% (normal 49-79%)  Absolute volumes:  LV EDV: (Normal 95-215 mL)  LV ESV: 49mL (Normal 25-85 mL)  LV SV: 80mL (Normal 61-145 mL)  CO: 4.67L/min (Normal 3.4-7.8 L/min)  Indexed volumes:  CI: 2.17L/min/sq-m (Normal 1.8-4.2 L/min/sq-m)  LV EDV: 64mL/sq-m (Normal 50-108 mL/sq-m)  LV ESV: 31mL/sq-m (normal 11-47  mL/sq-m)  LV SV: 75mL/sq-m (Normal 33-72 mL/sq-m)  Right ventricle:  RV male  RV EF:  51% (normal 51-80%)  Absolute volumes:  RV EDV: (Normal 109-217 mL)  RV ESV: 74mL (Normal 23-91 mL)  RV SV: 78mL (Normal 71-141 mL)  CO: 4.6L/min (Normal 2.8-8.8 L/min)  Indexed volumes:  CI: 2.1L/min/sq-m (normal 1.7-4.2 L/min/sq-m)  RV EDV: 104mL/sq-m (Normal 58-109 mL/sq-m)  RV ESV: 74mL/sq-m (Normal 12-46 mL/sq-m)  RV SV: 61mL/sq-m (Normal 38-71 mL/sq-m)  IMPRESSION: 1. Findings most consistent with hypertrophic cardiomyopathy with obstruction, sigmoid subtype. 15 mm basal septum with systolic anterior motion of the mitral valve and flow dephasing suggesting LVOT obstruction. Mild mitral valve regurgitation, RF 17.5%.  2. No delayed myocardial enhancement. ECV normal range. No myocardial edema. Normal T1 myocardial nulling kinetics, no evidence of infiltrative process.  3. Normal biventricular chamber size and function, LVEF 62%, RVEF 51%.   Electronically Signed By: Grady Lawman M.D. On: 02/09/2024 15:02   ______________________________________________________________________________________________      Physical Exam:    VS:  BP 110/70   Pulse 62   Wt 196 lb (88.9 kg)   SpO2 95%   BMI 28.12 kg/m    Wt Readings from Last 3 Encounters:  03/15/24 196 lb (88.9 kg)  01/11/24 206 lb 6.4 oz (93.6 kg)  10/19/23 215 lb (97.5 kg)    Gen: no distress   Neck: No JVD Cardiac: No Rubs or Gallops, Harsh systolic Murmur, RRR +2 radial pulses Respiratory: Clear to auscultation bilaterally, normal effort, normal  respiratory rate GI: Soft, nontender, non-distended  MS: No  edema;  moves all extremities Integument: Skin feels warm Neuro:  At time of evaluation, alert and oriented to person/place/time/situation  Psych: Normal affect, patient feels ok   ASSESSMENT AND PLAN: .    Hypertrophic obstructive cardiomyopathy  Severe obstructive hypertrophic  cardiomyopathy with an LVOT gradient of 76 mmHg. Symptoms include exertional dyspnea, fatigue, and occasional dizziness. No late gadolinium enhancement on MRI. Family history of sudden cardiac death. Current metoprolol  treatment has not significantly improved symptoms and may cause blurred vision. Discussed cardiac myosin inhibitors, alcohol septal ablation, and septal myectomy. Recommended cardiac myosin inhibitors due to mild symptoms and potential to reduce symptoms without surgery. Risks include potential congestive heart failure if medication is overly effective. Emphasized shared decision-making to improve symptoms and quality of life. Sudden cardiac death risk is 2.36%, not warranting a defibrillator currently. - Initiate cardiac myosin inhibitor therapy. - Monitor with echocardiograms at 4, 8, and 12 weeks, then every 6 months. - Wean off metoprolol  if symptoms improve with new medication. - Order heart monitor to assess for arrhythmias. - Discuss genetic counseling and testing with Dr. Drexel Gentles for family screening.  Moderate mitral, for tricuspid regurgitation Moderate mitral and tricuspid regurgitation on MRI, likely functional due to hypertrophic obstructive cardiomyopathy rather than primary valve disease. No immediate surgical intervention required.  Blurred vision possibly due to metoprolol  Blurred vision potentially related to metoprolol  use, developed over the last two weeks. Plan to transition to a different medication if symptoms persist. - Consider switching metoprolol  to diltiazem if blurred vision persist (hoping to avoid with mava start)  Abdominal aortic aneurysm Known abdominal aortic aneurysm, under surveillance with no acute changes.  Time Spent Directly with Patient:   I have spent a total of 62 minutes with the patient reviewing notes, imaging, EKGs, labs, and examining the patient as well as establishing an assessment and plan that was discussed personally with the  patient. Discussed disease state education, using shared decision making tools and cardiac modeling, and reviewing family risks and sudden cardiac death risk. Reviewed care and plan in collaboration with SA.  Discussed cardiac genetics. Provider education tools.   Gloriann Larger, MD FASE [facility] Cardiologist [facility]  [address] [place], [address] [place], [place] [address] [phone]  10:02 AM

## 2024-03-15 NOTE — Patient Instructions (Signed)
 Medication Instructions:  Your physician has recommended you make the following change in your medication:   START: mavacamten (Camzyos) 5 mg by mouth once daily.  You are projected to start on June 9 th this date may change.  Please do not start medication before this date without making contact with our office.   *If you need a refill on your cardiac medications before your next appointment, please call your pharmacy*  Lab Work: NONE  If you have labs (blood work) drawn today and your tests are completely normal, you will receive your results only by: MyChart Message (if you have MyChart) OR A paper copy in the mail If you have any lab test that is abnormal or we need to change your treatment, we will call you to review the results.  Testing/Procedures: You will need an Echocardiogram 4, 8 and 12 weeks after starting Mavacamten. Echo #1 due July 2-7 Echo #2 due Aug 1-4 Echo #3 due Aug 27-Sept 1 These dates may change  Your physician has requested that you have an echocardiogram. Echocardiography is a painless test that uses sound waves to create images of your heart. It provides your doctor with information about the size and shape of your heart and how well your heart's chambers and valves are working. This procedure takes approximately one hour. There are no restrictions for this procedure. Please do NOT wear cologne, perfume, aftershave, or lotions (deodorant is allowed). Please arrive 15 minutes prior to your appointment time.  Please note: We ask at that you not bring children with you during ultrasound (echo/ vascular) testing. Due to room size and safety concerns, children are not allowed in the ultrasound rooms during exams. Our front office staff cannot provide observation of children in our lobby area while testing is being conducted. An adult accompanying a patient to their appointment will only be allowed in the ultrasound room at the discretion of the ultrasound technician  under special circumstances. We apologize for any inconvenience.  Your physician has requested that you wear a heart monitor.   Follow-Up: At [facility], you and your health needs are our priority.  As part of our continuing mission to provide you with exceptional heart care, our providers are all part of one team.  This team includes your primary Cardiologist (physician) and Advanced Practice Providers or APPs (Physician Assistants and Nurse Practitioners) who all work together to provide you with the care you need, when you need it.  Your next appointment:   6 month(s)  Provider:   Gloriann Larger, MD   Other Instructions Delane Fear- Long Term Monitor Instructions  Your physician has requested you wear a ZIO patch monitor for 14 days.  This is a single patch monitor. [employer] supplies one patch monitor per enrollment. Additional stickers are not available. Please do not apply patch if you will be having a Nuclear Stress Test,  Cardiac CT, MRI, or Chest Xray during the period you would be wearing the  monitor. The patch cannot be worn during these tests. You cannot remove and re-apply the  ZIO XT patch monitor.  Your ZIO patch monitor will be mailed 3 day USPS to your address on file. It may take 3-5 days  to receive your monitor after you have been enrolled.  Once you have received your monitor, please review the enclosed instructions. Your monitor  has already been registered assigning a specific monitor serial # to you.  Billing and Patient Assistance Program Information  We have supplied  [employer] with any of your insurance information on file for billing purposes. [employer] offers a sliding scale Patient Assistance Program for patients that do not have  insurance, or whose insurance does not completely cover the cost of the ZIO monitor.  You must apply for the Patient Assistance Program to qualify for this discounted rate.  To apply, please call [employer] at [phone],  select option 4, select option 2, ask to apply for  Patient Assistance Program. Sanna Crystal will ask your household income, and how many people  are in your household. They will quote your out-of-pocket cost based on that information.  [employer] will also be able to set up a 39-month, interest-free payment plan if needed.  Applying the monitor   Shave hair from upper left chest.  Hold abrader disc by orange tab. Rub abrader in 40 strokes over the upper left chest as  indicated in your monitor instructions.  Clean area with 4 enclosed alcohol pads. Let dry.  Apply patch as indicated in monitor instructions. Patch will be placed under collarbone on left  side of chest with arrow pointing upward.  Rub patch adhesive wings for 2 minutes. Remove white label marked "1". Remove the white  label marked "2". Rub patch adhesive wings for 2 additional minutes.  While looking in a mirror, press and release button in center of patch. A small green light will  flash 3-4 times. This will be your only indicator that the monitor has been turned on.  Do not shower for the first 24 hours. You may shower after the first 24 hours.  Press the button if you feel a symptom. You will hear a small click. Record Date, Time and  Symptom in the Patient Logbook.  When you are ready to remove the patch, follow instructions on the last 2 pages of Patient  Logbook. Stick patch monitor onto the last page of Patient Logbook.  Place Patient Logbook in the blue and white box. Use locking tab on box and tape box closed  securely. The blue and white box has prepaid postage on it. Please place it in the mailbox as  soon as possible. Your physician should have your test results approximately 7 days after the  monitor has been mailed back to [facility].  Call [facility] Customer Care at [phone] if you have questions regarding  your ZIO XT patch monitor. Call them immediately if you see an orange light blinking on your   monitor.  If your monitor falls off in less than 4 days, contact our Monitor department at [phone].  If your monitor becomes loose or falls off after 4 days call [employer] at [phone] for  suggestions on securing your monitor

## 2024-03-15 NOTE — Addendum Note (Signed)
 Addended by: Christine Cozier on: 03/15/2024 12:39 PM   Modules accepted: Orders

## 2024-03-15 NOTE — Addendum Note (Signed)
 Addended by: Christine Cozier on: 03/15/2024 02:50 PM   Modules accepted: Orders

## 2024-03-15 NOTE — Progress Notes (Unsigned)
 Enrolled for [employer] to mail a ZIO XT long term holter monitor to the patients address on file.

## 2024-03-15 NOTE — Telephone Encounter (Signed)
 PA for Camzyos submitted Key: [id number]- start form faxed

## 2024-03-16 NOTE — Progress Notes (Signed)
 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 Caucasian gentleman who is here today with his wife, Cody Flores. He reports having light headedness for the last 4-5 years especially when he bent down to pick up something and suspected this was due to a sinus issue. However, his symptoms progressively worsened over the last year and he began experiencing dyspnea with exertion, fatigue, chest tightness and heaviness, heart palpitations that was pronounced at the end of the day and light headedness. Denies having syncope.  His PCP referred him to a cardiologist when he noticed a heart murmur that was evident a couple of years ago but not reproduced at his last visit. His cardiac MRI (12/1723) demonstrated asymmetric septal hypertrophy with obstructive physiology, basal septum at 1.5cm, SAM, LVEF 62% and no scar burden or aneurysm.  Traditional Risk Factors Cody Flores was diagnosed with HTN around age 23 and states that it is well-controlled with medication.  Family history  Relation to Proband Pedigree # Current age Heart condition/age of onset Notes  Daughter IV.4 36 None In remission for Hodgkin's lymphoma. Chemotherapy course completes in 2024. Echocardiogram normal @ 34  Grandchildren None           Sister III.1 63 None Echo/EKG to be done  Brother III.3 57 Afib Echo/EKG to be done  Nephew, nieces IV.1-IV.3, IV.5-IV.7 41-17 Deceased None IV.2- swimming by himself and drowned @ 51        Father II.3 Deceased TAA @ 19, repaired Died @ 11- respiratory failure from asbestosis  Paternal aunt II.2 60 None Daughter born w/ heart issues- details?  Paternal uncles II.1 Deceased None Died of M.I. @ 42. He was building his home, he sat down to relax when he had a M.I. was taken to the hospital and recovered but later died from  another M.I at the hospital.  Paternal grandfather 1.1 Deceased None Died @ 13- M.I.  Paternal grandmother I.2 Deceased None Died @ 82-heart disease- details unknown as she was very private.        Mother II.4 48 "enlarged heart" @ 50, now no longer enlarged!? Not screened for HCM recently  Maternal aunts, 3 II.5-II.7 78, 75 Deceased None II.7- Died of lung cancer @ 50  Maternal uncles, 2 II.8-II.9 Deceased None II.8- Died of heart failure @ 43s II.9- @ 82- stroke>later coma and then died  Maternal grandfather I.3 Deceased None Died @ 3- leukemia  Maternal grandmother I.4 Deceased None Died @ 23- poor health   Genetics Cody Flores was counseled on the genetics of hypertrophic cardiomyopathy (HCM). I explained to the patient that this is an autosomal dominant condition with incomplete penetrance i.e. not all individuals harboring the HCM mutation will present clinically with HCM, and age-related penetrance where clinical presentation of HCM increases with advanced age. Variability in clinical expression is also seen in families with HCM with affected family members presenting clinically at different ages and with symptoms ranging from mild to severe.  Since HCM is an autosomal dominant condition, first degree-relatives are at a 50% risk of inheriting this condition. They should seek regular surveillance for HCM.  First-degree relatives include his daughter, brother and sister. It is recommended that his mother, if she is willing , also undergo a baseline screening as she was previously told of having an enlarged heart that later normalized! At this  time his nephews and nieces do not need to undergo screening- they can be screened if they are symptomatic or if their parent is found to have HCM.    Clinical screening of first-degree relatives involves echocardiogram and EKG at regular intervals, frequency is typically determined by age, with children undergoing screening every year until the age of 2 and  those over the age of 51 getting screened every 3-5 years until the age of 37. Patient verbalized understanding of this.  Also briefly discussed the inheritance pattern and treatment /management plans for the infiltrative cardiomyopathies that present as HCM phenocopies. About 8-10% of HCM patients can have compound and digenic sarcomeric mutations for HCM  Patient should be aware that genetic testing is a probabilistic test dependent upon age and severity of presentation, presence of risk factors for HCM and importantly family history of HCM or sudden death in first-degree relatives. The potential outcomes of genetic testing and subsequent management of at-risk family members is listed below-  If a mutation is not identified then it is important that he understands that HCM is a genetic condition and can be passed down to his children. All first-degree relatives should undergo regular screening for HCM.  A negative test result can be due to limitations of the genetic test.   There is also the likelihood of identifying a "Variant of unknown significance". This result means that the variant has not been detected in a statistically significant number of HCM patients and/or functional studies have not been performed to verify its pathogenicity. This VUS can be tested in the family to see if it segregates with disease. If a VUS is found, first-degree relatives should undergo regular clinical screening for HCM, but genetic testing for the VUS is otherwise not warranted.  If a pathogenic variant is reported, then first-degree family members can get tested for this variant. If they test positive, it is likely they will develop HCM. In light of variable expression and incomplete penetrance associated with HCM, it is not possible to predict when they will manifest clinically with HCM. It is recommended that family members that test positive for the familial pathogenic variant pursue clinical screening for HCM.  Family members that test negative for the familial mutation need not pursue periodic screening for HCM, but seek care if symptoms develop.   Impression  Cody Flores was found to have cardiac wall thickness suggestive of HCM at age 9 in the absence of other cardiac loading conditions that can lead to cardiac hypertrophy. There is no family history of HCM or sudden death. 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    Genetic testing is recommended to confirm his diagnosis. This test should include the major sarcomeric genes involved in HCM, namely MYBPPC3, MYH7, TNNI3, TNNT2, TPM1, ACTC1, MYL2 and MYL3. It should also include the genes involved in HCM phenocopies as cardiac-predominant forms of these conditions present clinically as HCM. These include genes for Fabry disease (GLA), Danon disease (LAMP2), WPW syndrome (PRKAG2), Familial transthyretin amyloidosis (TTR) and phospholamban (PLN).  In addition, patient should be aware of protections afforded by the Genetic Information Non-Discrimination Act (GINA). GINA protects a patient from losing their employment or health insurance based on their genotype. However, these protections do not cover life insurance and disability. Explained to the patient that family members that are found to have the familial genetic mutation will be denied life  insurance even if they are asymptomatic and do not exhibit clinical signs of HCM. He verbalized understanding.   Please note that the patient has not been counseled in this visit on other personal, cultural, or ethical issues that she may face due to her heart condition.   Plan After a thorough discussion of the risk and benefits of genetic testing for HCM, Cody Flores expresses interest in pursuing genetic testing for HCM and signed the informed consent. Blood was drawn today for HCM genetic testing.    Verdis Glade, Ph.D,  [facility] Clinical Molecular Geneticist

## 2024-03-17 ENCOUNTER — Encounter: Payer: Self-pay | Admitting: *Deleted

## 2024-03-17 DIAGNOSIS — Z006 Encounter for examination for normal comparison and control in clinical research program: Secondary | ICD-10-CM

## 2024-03-17 NOTE — Telephone Encounter (Signed)
 PA was denied since patient has not tried BB and non-DHP CCB. He cannot take non-DHP CCB since he is borderline bradycardic with metoprolol . Appeals has been faxed.

## 2024-03-17 NOTE — Research (Signed)
 Called Mr. Cody Flores to follow-up on the Shared Decision Support Intervention Project discussed with him by Dr. Paulita Boss. He had the opportunity to read the consent. He did not have any questions and agreed to participate. The questionnaires were completed.

## 2024-03-18 NOTE — Telephone Encounter (Signed)
 Appeals was received. Should have answer within 15 days. Looks like free trial was sent. Patient should hold off stating until we know if we will win the appeal. LVM for pt to call back.

## 2024-03-18 NOTE — Telephone Encounter (Signed)
 Spoke with patient. Gave him update on insurance. He has free trial. He is aware not to start yet. His planned start date is 6/9 but he will NOT start it then if we have not gotten insurance approval yet.

## 2024-03-30 NOTE — Telephone Encounter (Signed)
 Appeals was denied. Insurance requires that he switch from BB to a Non DHP CCB.

## 2024-04-03 ENCOUNTER — Encounter: Payer: Self-pay | Admitting: Internal Medicine

## 2024-04-06 DIAGNOSIS — K760 Fatty (change of) liver, not elsewhere classified: Secondary | ICD-10-CM | POA: Diagnosis not present

## 2024-04-06 DIAGNOSIS — Z0000 Encounter for general adult medical examination without abnormal findings: Secondary | ICD-10-CM | POA: Diagnosis not present

## 2024-04-06 DIAGNOSIS — E860 Dehydration: Secondary | ICD-10-CM | POA: Diagnosis not present

## 2024-04-06 DIAGNOSIS — K6389 Other specified diseases of intestine: Secondary | ICD-10-CM | POA: Diagnosis not present

## 2024-04-06 DIAGNOSIS — K529 Noninfective gastroenteritis and colitis, unspecified: Secondary | ICD-10-CM | POA: Diagnosis not present

## 2024-04-10 ENCOUNTER — Telehealth: Payer: Self-pay | Admitting: Internal Medicine

## 2024-04-10 NOTE — Telephone Encounter (Signed)
 Patient notes that he is doing better Had acute colitis with amoxicilling this have resolved Has NYHA III sx that resolved with improvement in Colitis Had EF eval in [facility]  No chest pain or pressure .  No SOBand no PND/Orthopnea.  No weight gain or leg swelling.  No palpitations or syncope .  He has NYHA II symptoms with exertional DOE and occasional lightheadedness.  Heart rates are 60s and has felt no better on BB.  Insurance will not cover CMI therapy at this time.  We will stop metoprolol  and start diltiazem 120 mg PO XL We will keep his 05/30/24 Echo and do Squat to Stand and provocation If no improvement at that time we will re-submit for mavacamten .  Gloriann Larger, MD FASE [facility] Cardiologist [facility]  [address] [place], [place] [address] [phone]  12:38 PM

## 2024-04-12 ENCOUNTER — Other Ambulatory Visit: Payer: Self-pay

## 2024-04-12 ENCOUNTER — Other Ambulatory Visit (HOSPITAL_COMMUNITY): Payer: Self-pay

## 2024-04-12 MED ORDER — DILTIAZEM HCL ER COATED BEADS 120 MG PO CP24
120.0000 mg | ORAL_CAPSULE | Freq: Every day | ORAL | 3 refills | Status: DC
  Filled 2024-04-12: qty 90, 90d supply, fill #0

## 2024-04-12 NOTE — Addendum Note (Signed)
 Addended by: Christine Cozier on: 04/12/2024 08:14 AM   Modules accepted: Orders

## 2024-04-12 NOTE — Telephone Encounter (Signed)
 Called pt to verify pharmacy: pt would like diltiazem sent to [facility] pharmacy.  Rescheduled echocardiogram for the same time and date that was canceled. Pt is agreeable to this appointment. Pt had no questions or concerns.

## 2024-04-13 DIAGNOSIS — R42 Dizziness and giddiness: Secondary | ICD-10-CM | POA: Diagnosis not present

## 2024-04-13 DIAGNOSIS — I421 Obstructive hypertrophic cardiomyopathy: Secondary | ICD-10-CM | POA: Diagnosis not present

## 2024-04-17 ENCOUNTER — Ambulatory Visit: Payer: Self-pay | Admitting: Internal Medicine

## 2024-04-17 DIAGNOSIS — R42 Dizziness and giddiness: Secondary | ICD-10-CM | POA: Diagnosis not present

## 2024-04-17 DIAGNOSIS — I421 Obstructive hypertrophic cardiomyopathy: Secondary | ICD-10-CM

## 2024-04-17 DIAGNOSIS — Z8241 Family history of sudden cardiac death: Secondary | ICD-10-CM | POA: Diagnosis not present

## 2024-04-18 ENCOUNTER — Ambulatory Visit: Payer: 59 | Admitting: Family Medicine

## 2024-04-19 ENCOUNTER — Other Ambulatory Visit: Payer: Self-pay

## 2024-05-02 ENCOUNTER — Other Ambulatory Visit (HOSPITAL_COMMUNITY)

## 2024-05-09 NOTE — Telephone Encounter (Signed)
 Key: [id number] Resubmitted PA with diltiazem  failure info

## 2024-05-12 NOTE — Telephone Encounter (Signed)
 Prior authorization now approved through 09/08/2024 for Camzyos 

## 2024-05-13 ENCOUNTER — Encounter: Payer: Self-pay | Admitting: Internal Medicine

## 2024-05-17 ENCOUNTER — Other Ambulatory Visit: Payer: Self-pay | Admitting: Pharmacist

## 2024-05-19 ENCOUNTER — Other Ambulatory Visit (HOSPITAL_COMMUNITY): Payer: Self-pay

## 2024-05-19 ENCOUNTER — Other Ambulatory Visit: Payer: Self-pay

## 2024-05-19 MED ORDER — METOPROLOL TARTRATE 25 MG PO TABS
25.0000 mg | ORAL_TABLET | Freq: Two times a day (BID) | ORAL | 3 refills | Status: AC
  Filled 2024-05-19: qty 180, 90d supply, fill #0

## 2024-05-19 NOTE — Telephone Encounter (Signed)
 Called pt advised of MD recommendations: Can we cancel his 05/30/24 echo? I would like to start him on mavacamten  and adjusted his echos for 02-02-11 weeks. Return to his BB only; long term my goal is to get him off of AV nodal agents (fatigue)   Thanks, MA  Pt to stop diltiazem  and restart metoprolol  25 mg PO BID sent to pharmacy of pt choice.  Will work on start date and echo dates for Camzyos  start.

## 2024-05-20 MED ORDER — MAVACAMTEN 5 MG PO CAPS: 5.0000 mg | ORAL_CAPSULE | Freq: Every day | ORAL | 0 refills | Status: DC

## 2024-05-25 NOTE — Telephone Encounter (Signed)
 Pt to start Camzyos  on 06/01/24.  Placed orders with date ranges for 4,8, and 12 week echo.

## 2024-05-30 ENCOUNTER — Other Ambulatory Visit (HOSPITAL_COMMUNITY)

## 2024-05-30 ENCOUNTER — Ambulatory Visit: Admitting: Family Medicine

## 2024-06-10 ENCOUNTER — Encounter (HOSPITAL_COMMUNITY): Payer: Self-pay | Admitting: *Deleted

## 2024-06-17 ENCOUNTER — Other Ambulatory Visit: Payer: Self-pay

## 2024-06-17 ENCOUNTER — Other Ambulatory Visit: Payer: Self-pay | Admitting: Family Medicine

## 2024-06-17 ENCOUNTER — Other Ambulatory Visit (HOSPITAL_COMMUNITY): Payer: Self-pay

## 2024-06-17 DIAGNOSIS — I10 Essential (primary) hypertension: Secondary | ICD-10-CM

## 2024-06-17 MED ORDER — LISINOPRIL-HYDROCHLOROTHIAZIDE 20-25 MG PO TABS
1.0000 | ORAL_TABLET | Freq: Every day | ORAL | 1 refills | Status: AC
Start: 2024-06-17 — End: ?
  Filled 2024-06-17: qty 90, 90d supply, fill #0
  Filled 2024-09-14: qty 90, 90d supply, fill #1

## 2024-06-17 MED ORDER — RYBELSUS 3 MG PO TABS
3.0000 mg | ORAL_TABLET | Freq: Every day | ORAL | 1 refills | Status: AC
Start: 2024-06-17 — End: ?
  Filled 2024-06-17: qty 30, 30d supply, fill #0
  Filled 2024-07-26: qty 30, 30d supply, fill #1

## 2024-06-17 NOTE — Telephone Encounter (Signed)
 Hold for 06/23/24 appt.

## 2024-06-23 ENCOUNTER — Ambulatory Visit: Admitting: Family Medicine

## 2024-06-23 ENCOUNTER — Other Ambulatory Visit (HOSPITAL_COMMUNITY)

## 2024-06-23 ENCOUNTER — Encounter: Payer: Self-pay | Admitting: Family Medicine

## 2024-06-23 VITALS — BP 113/66 | HR 72 | Ht 70.0 in | Wt 201.0 lb

## 2024-06-23 DIAGNOSIS — E1165 Type 2 diabetes mellitus with hyperglycemia: Secondary | ICD-10-CM

## 2024-06-23 DIAGNOSIS — J439 Emphysema, unspecified: Secondary | ICD-10-CM | POA: Diagnosis not present

## 2024-06-23 DIAGNOSIS — F17210 Nicotine dependence, cigarettes, uncomplicated: Secondary | ICD-10-CM | POA: Diagnosis not present

## 2024-06-23 DIAGNOSIS — I421 Obstructive hypertrophic cardiomyopathy: Secondary | ICD-10-CM

## 2024-06-23 DIAGNOSIS — I10 Essential (primary) hypertension: Secondary | ICD-10-CM | POA: Diagnosis not present

## 2024-06-23 DIAGNOSIS — E782 Mixed hyperlipidemia: Secondary | ICD-10-CM

## 2024-06-23 DIAGNOSIS — Z23 Encounter for immunization: Secondary | ICD-10-CM | POA: Diagnosis not present

## 2024-06-23 DIAGNOSIS — R195 Other fecal abnormalities: Secondary | ICD-10-CM

## 2024-06-23 DIAGNOSIS — K635 Polyp of colon: Secondary | ICD-10-CM

## 2024-06-23 LAB — POCT UA - MICROALBUMIN
Albumin/Creatinine Ratio, Urine, POC: <30
Creatinine, POC: 300 mg/dL
Microalbumin Ur, POC: 10 mg/L

## 2024-06-23 LAB — POCT GLYCOSYLATED HEMOGLOBIN (HGB A1C): HbA1c, POC (controlled diabetic range): 6.8 % (ref 0.0–7.0)

## 2024-06-23 NOTE — Progress Notes (Signed)
 Or soreness that here is probably SUNNY GAINS - 58 y.o. male MRN [id number]  Date of birth: 1966/08/19  Subjective Chief Complaint  Patient presents with   Diabetes   Hypertension    HPI Cody Flores is a 58 y.o. male her today for follow up.   Since the last time he was here he was seen by cardiology and dx with hypertrophic cardiomyopathy.   Started on Camzyos .  He remains on lisinopril /hydrochlorothiazide .  Plan to repeat Echo at upcoming cardiology appt.  Denies chest pain, increased dyspnea or lightheadedness.   Continues on Rybelsus  for management of diabetes.  Overall he is tolerating this well at current strength.  A1c today is 6.8%.  He had an episode of colitis that he thinks Rybelsus  contributed to.  He tried going off of Rybeluss for a few weeks but didn't really note improvement with stopping this.  He has since restarted this.   ROS:  A comprehensive ROS was completed and negative except as noted per HPI   No Known Allergies  Past Medical History:  Diagnosis Date   Abdominal aortic aneurysm (AAA) (HCC) 05/31/2019   3 cm.  Unchanged November 2018   COPD (chronic obstructive pulmonary disease) (HCC)    Diverticulitis    Emphysema of lung (HCC)    GERD (gastroesophageal reflux disease)    History of colon polyps    HTN (hypertension) 03/05/2017    Past Surgical History:  Procedure Laterality Date   COLECTOMY  2013   Hospital in [place]    COLONOSCOPY  04/09/2012   [facility]    ESOPHAGOGASTRODUODENOSCOPY     over 20 years ago in [place]    LAPAROSCOPIC SIGMOID COLECTOMY     VASECTOMY      Social History   Socioeconomic History   Marital status: Married    Spouse name: Not on file   Number of children: Not on file   Years of education: Not on file   Highest education level: Not on file  Occupational History   Not on file  Tobacco Use   Smoking status: Every Day    Current packs/day: 1.50    Types: Cigarettes    Smokeless tobacco: Never  Vaping Use   Vaping status: Never Used  Substance and Sexual Activity   Alcohol use: Yes    Alcohol/week: 0.0 standard drinks of alcohol    Comment: ocassionally/rare   Drug use: No   Sexual activity: Not on file  Other Topics Concern   Not on file  Social History Narrative   ** Merged History Encounter **       Social Drivers of Corporate investment banker Strain: Not on file  Food Insecurity: Not on file  Transportation Needs: Not on file  Physical Activity: Not on file  Stress: Not on file  Social Connections: Not on file    Family History  Problem Relation Age of Onset   Aneurysm Father    Colon cancer Father        said tumor was removed    Diabetes Maternal Grandmother    Esophageal cancer Neg Hx    Colon polyps Neg Hx    Stomach cancer Neg Hx    Rectal cancer Neg Hx     Health Maintenance  Topic Date Due   OPHTHALMOLOGY EXAM  Never done   Diabetic kidney evaluation - Urine ACR  Never done   Hepatitis B Vaccines 19-59 Average Risk (1 of 3 - 19+ 3-dose  series) Never done   Pneumococcal Vaccine: 50+ Years (2 of 2 - PCV) 05/30/2020   COVID-19 Vaccine (3 - Moderna risk series) 07/18/2020   Colonoscopy  05/06/2024   INFLUENZA VACCINE  05/27/2024   HIV Screening  10/18/2024 (Originally 01/12/1981)   Diabetic kidney evaluation - eGFR measurement  10/18/2024   HEMOGLOBIN A1C  12/24/2024   FOOT EXAM  06/23/2025   DTaP/Tdap/Td (2 - Td or Tdap) 01/02/2026   Hepatitis C Screening  Completed   Zoster Vaccines- Shingrix  Completed   HPV VACCINES  Aged Out   Meningococcal B Vaccine  Aged Out     ----------------------------------------------------------------------------------------------------------------------------------------------------------------------------------------------------------------- Physical Exam BP 113/66 (BP Location: Left Arm, Patient Position: Sitting, Cuff Size: Normal)   Pulse 72   Ht 5' 10 (1.778 m)   Wt 201  lb (91.2 kg)   SpO2 95%   BMI 28.84 kg/m   Physical Exam Constitutional:      Appearance: Normal appearance.  Cardiovascular:     Rate and Rhythm: Normal rate.  Pulmonary:     Effort: Pulmonary effort is normal.     Breath sounds: Normal breath sounds.  Neurological:     General: No focal deficit present.     Mental Status: He is alert.  Psychiatric:        Mood and Affect: Mood normal.        Behavior: Behavior normal.     ------------------------------------------------------------------------------------------------------------------------------------------------------------------------------------------------------------------- Assessment and Plan  Type 2 diabetes mellitus with hyperglycemia (HCC) Diabetes is much better controlled.  Will continue with Rybelsus  3mg /daily.  Continue dietary and lifestyle changes.   Nicotine  dependence Counseled on smoking cessation.   COPD (chronic obstructive pulmonary disease) (HCC) Counseled on quitting smoking.  Has not had success with chantix  or bupropion .  Albuterol  PRN  HLD (hyperlipidemia) Update lipid panel   HTN (hypertension) BP remains well controlled.  Continue with lisinopril /hctz at current strength.  Smoking cessation encouraged.   HOCM (hypertrophic obstructive cardiomyopathy) (HCC) On Camzyos  and followed by cardiology.   Loose stools Having some intermittent loose stools since episode of colitis.  Recommend pro-biotic. Due for repeat colon cancer screening.  Referral entered.    No orders of the defined types were placed in this encounter.   Return in about 6 months (around 12/24/2024) for Hypertension, Type 2 Diabetes.

## 2024-06-23 NOTE — Assessment & Plan Note (Signed)
 Diabetes is much better controlled.  Will continue with Rybelsus  3mg /daily.  Continue dietary and lifestyle changes.

## 2024-06-23 NOTE — Assessment & Plan Note (Signed)
 Having some intermittent loose stools since episode of colitis.  Recommend pro-biotic. Due for repeat colon cancer screening.  Referral entered.

## 2024-06-23 NOTE — Assessment & Plan Note (Signed)
 On Camzyos  and followed by cardiology.

## 2024-06-23 NOTE — Assessment & Plan Note (Signed)
BP remains well controlled.  Continue with lisinopril/hctz at current strength.  Smoking cessation encouraged.

## 2024-06-23 NOTE — Assessment & Plan Note (Signed)
 Counseled on smoking cessation

## 2024-06-23 NOTE — Assessment & Plan Note (Signed)
 Counseled on quitting smoking.  Has not had success with chantix  or bupropion .  Albuterol  PRN

## 2024-06-23 NOTE — Assessment & Plan Note (Signed)
 Update lipid panel.

## 2024-06-24 ENCOUNTER — Ambulatory Visit (HOSPITAL_COMMUNITY)
Admission: RE | Admit: 2024-06-24 | Discharge: 2024-06-24 | Disposition: A | Discharge status: Home / Self Care | Source: Ambulatory Visit | Attending: Cardiology | Admitting: Cardiology

## 2024-06-24 DIAGNOSIS — I421 Obstructive hypertrophic cardiomyopathy: Secondary | ICD-10-CM | POA: Diagnosis not present

## 2024-06-24 LAB — ECHOCARDIOGRAM SQUAT TO STAND
Area-P 1/2: 3.12 cm2
S' Lateral: 2.6 cm

## 2024-06-27 NOTE — Progress Notes (Signed)
 Called patient to review results. LVEF 70 with LBOT gradient he know it's NYHA class two symptoms with symptoms going up and downstairs that are relatively unchanged. No new medications. We will repeat echo squat to stand study in eight weeks. We will have him call in for his medication tomorrow afternoon at 12 weeks. We may augment therapy based off of imaging and symptoms.  Dr. JAYSON

## 2024-06-28 ENCOUNTER — Telehealth: Payer: Self-pay | Admitting: Internal Medicine

## 2024-06-28 ENCOUNTER — Other Ambulatory Visit (HOSPITAL_COMMUNITY)

## 2024-06-28 MED ORDER — MAVACAMTEN 5 MG PO CAPS: 5.0000 mg | ORAL_CAPSULE | Freq: Every day | ORAL | 0 refills | Status: DC

## 2024-06-28 NOTE — Telephone Encounter (Signed)
 Calling to ask that we upload patient status from to the [facility] portal. Please advise

## 2024-06-28 NOTE — Telephone Encounter (Signed)
 PSF updated on Camzyos  [facility] portal.  Specialty pharmacy on file is [facility].  Called [facility]; was [facility] network for pt insurance.  Refill camzyos  5 mg [facility].

## 2024-07-21 ENCOUNTER — Other Ambulatory Visit (HOSPITAL_COMMUNITY)

## 2024-07-21 ENCOUNTER — Ambulatory Visit (HOSPITAL_COMMUNITY)
Admission: RE | Admit: 2024-07-21 | Discharge: 2024-07-21 | Disposition: A | Discharge status: Home / Self Care | Source: Ambulatory Visit | Attending: Cardiology | Admitting: Cardiology

## 2024-07-21 ENCOUNTER — Inpatient Hospital Stay (HOSPITAL_COMMUNITY): Admission: RE | Admit: 2024-07-21 | Source: Ambulatory Visit

## 2024-07-21 DIAGNOSIS — I421 Obstructive hypertrophic cardiomyopathy: Secondary | ICD-10-CM | POA: Diagnosis not present

## 2024-07-21 LAB — ECHOCARDIOGRAM SQUAT TO STAND
Area-P 1/2: 4.02 cm2
MV M vel: 4.31 m/s
MV Peak grad: 74.1 mmHg
S' Lateral: 2.9 cm

## 2024-07-24 ENCOUNTER — Ambulatory Visit: Payer: Self-pay | Admitting: Internal Medicine

## 2024-07-24 NOTE — Telephone Encounter (Signed)
 Patient notes that he is doing great.   Since last visit notes no symptoms.  LVOT improved from 100 mm Hg to 23 mm Hg LVEF 65%. No new medications.  No Symptoms.  Has not attempted going up or down stairs as part of his exercise at this time.  I believe this is his 8 week echo- continue mavacamten  5 mg dose.  Has a 12 week echo pending.  If no changes at that time, given NYHA I will move to a 6 month f/u.  Cody Leavens, MD FASE [facility] Cardiologist [facility]  [address] [place], [place] [address] [phone]  1:57 PM

## 2024-07-26 ENCOUNTER — Other Ambulatory Visit (HOSPITAL_COMMUNITY): Payer: Self-pay

## 2024-07-26 MED ORDER — MAVACAMTEN 5 MG PO CAPS: 5.0000 mg | ORAL_CAPSULE | Freq: Every day | ORAL | 0 refills | Status: DC

## 2024-07-26 NOTE — Addendum Note (Signed)
 Addended by: RANDY HAMP SAILOR on: 07/26/2024 12:18 PM   Modules accepted: Orders

## 2024-07-28 ENCOUNTER — Encounter (HOSPITAL_COMMUNITY): Payer: Self-pay | Admitting: *Deleted

## 2024-08-18 ENCOUNTER — Ambulatory Visit (HOSPITAL_COMMUNITY)
Admission: RE | Admit: 2024-08-18 | Discharge: 2024-08-18 | Disposition: A | Discharge status: Home / Self Care | Source: Ambulatory Visit | Attending: Internal Medicine | Admitting: Internal Medicine

## 2024-08-18 DIAGNOSIS — I421 Obstructive hypertrophic cardiomyopathy: Secondary | ICD-10-CM | POA: Insufficient documentation

## 2024-08-18 LAB — ECHOCARDIOGRAM SQUAT TO STAND
Area-P 1/2: 3.46 cm2
S' Lateral: 2.2 cm

## 2024-08-21 ENCOUNTER — Telehealth: Payer: Self-pay | Admitting: Internal Medicine

## 2024-08-21 DIAGNOSIS — I421 Obstructive hypertrophic cardiomyopathy: Secondary | ICD-10-CM

## 2024-08-21 NOTE — Telephone Encounter (Signed)
 Patient notes that he is doing well.   Since last visit notes one episode of DOE when lifting up an empty file cabinet There are no interval hospital/ED visit. No new medications.   Echo showed LVOT gradient 48.  LVEF 65%.  No chest pain or pressure .  No SOB/DOE and no PND/Orthopnea.  No weight gain or leg swelling.  No palpitations or syncope.  Discussed tx options.  Will plan: Celeste- increase mavacamten  dose to 10 mg.  A repeat squat to stand echo needs to be ordered for 4 week from today and 12 weeks from today Team- needs f/u visit with me during the 4 week echo Delon- November would be his 6 month from the research side.  Thanks!  Stanly Leavens, MD FASE [facility] Cardiologist [facility]  [address] [place], [place] [address] [phone]  4:01 PM

## 2024-08-23 MED ORDER — MAVACAMTEN 10 MG PO CAPS: 10.0000 mg | ORAL_CAPSULE | Freq: Every day | ORAL | 0 refills | Status: DC

## 2024-08-23 NOTE — Telephone Encounter (Signed)
 RN updated [facility] portal form, sent Camzyos  10mg  to specialty pharmacy, ordered 4 week ECHO and sent message to Olam Ford in [facility] department for assistance.

## 2024-08-23 NOTE — Addendum Note (Signed)
 Addended by: VICCI CANTOR B on: 08/23/2024 08:49 AM   Modules accepted: Orders

## 2024-08-26 ENCOUNTER — Other Ambulatory Visit (HOSPITAL_COMMUNITY): Payer: Self-pay

## 2024-08-26 ENCOUNTER — Encounter: Payer: Self-pay | Admitting: Internal Medicine

## 2024-08-26 ENCOUNTER — Other Ambulatory Visit: Payer: Self-pay | Admitting: Family Medicine

## 2024-08-26 MED ORDER — ALBUTEROL SULFATE HFA 108 (90 BASE) MCG/ACT IN AERS
2.0000 | INHALATION_SPRAY | Freq: Four times a day (QID) | RESPIRATORY_TRACT | 11 refills | Status: AC | PRN
  Filled 2024-08-26: qty 6.7, 25d supply, fill #0
  Filled 2024-10-06: qty 6.7, 25d supply, fill #1
  Filled 2024-11-09: qty 6.7, 25d supply, fill #2

## 2024-08-29 ENCOUNTER — Other Ambulatory Visit (HOSPITAL_COMMUNITY): Payer: Self-pay

## 2024-08-29 ENCOUNTER — Telehealth: Payer: Self-pay | Admitting: Pharmacy Technician

## 2024-08-29 NOTE — Telephone Encounter (Signed)
   Pharmacy Patient Advocate Encounter   Received notification from CoverMyMeds that prior authorization for camzyos  is required/requested.   Insurance verification completed.   The patient is insured through [facility].   Per test claim: PA required; PA submitted to above mentioned insurance via Latent Key/confirmation #/EOC [id number] Status is pending

## 2024-08-29 NOTE — Telephone Encounter (Signed)
    No scan of the copay card in chart. Patient has card

## 2024-08-30 NOTE — Telephone Encounter (Signed)
 Pharmacy Patient Advocate Encounter  Received notification from [facility] that Prior Authorization for camzyos  has been APPROVED from 08/29/24 to 08/28/25   PA #/Case ID/Reference #: [id number]

## 2024-09-05 NOTE — Addendum Note (Signed)
 Encounter addended by: Vicenta Nolon GAILS on: 09/05/2024 3:04 PM  Actions taken: Imaging Exam ended

## 2024-09-14 ENCOUNTER — Encounter: Payer: Self-pay | Admitting: Gastroenterology

## 2024-09-14 ENCOUNTER — Other Ambulatory Visit (HOSPITAL_COMMUNITY): Payer: Self-pay

## 2024-09-19 ENCOUNTER — Encounter (HOSPITAL_BASED_OUTPATIENT_CLINIC_OR_DEPARTMENT_OTHER): Payer: Self-pay

## 2024-09-20 ENCOUNTER — Ambulatory Visit (HOSPITAL_COMMUNITY)
Admission: RE | Admit: 2024-09-20 | Discharge: 2024-09-20 | Disposition: A | Discharge status: Home / Self Care | Source: Ambulatory Visit | Attending: Internal Medicine | Admitting: Internal Medicine

## 2024-09-20 ENCOUNTER — Ambulatory Visit: Admitting: Internal Medicine

## 2024-09-20 ENCOUNTER — Telehealth: Payer: Self-pay

## 2024-09-20 VITALS — BP 110/76 | HR 84 | Ht 69.0 in | Wt 211.0 lb

## 2024-09-20 DIAGNOSIS — Z8241 Family history of sudden cardiac death: Secondary | ICD-10-CM | POA: Diagnosis not present

## 2024-09-20 DIAGNOSIS — I421 Obstructive hypertrophic cardiomyopathy: Secondary | ICD-10-CM | POA: Insufficient documentation

## 2024-09-20 DIAGNOSIS — R011 Cardiac murmur, unspecified: Secondary | ICD-10-CM | POA: Insufficient documentation

## 2024-09-20 LAB — ECHOCARDIOGRAM SQUAT TO STAND
Area-P 1/2: 3.56 cm2
S' Lateral: 2.9 cm

## 2024-09-20 MED ORDER — MAVACAMTEN 10 MG PO CAPS: 10.0000 mg | ORAL_CAPSULE | Freq: Every day | ORAL | 1 refills | Status: DC

## 2024-09-20 NOTE — Telephone Encounter (Signed)
 Per Dr. Junnie LVEF 60%, LVOT gradient 34 mm Hg (squat to stand); echo f/u in 8 weeks (his 12 week study) continue 10 mg dose  Tried to update [facility] portal. Ordered echo and sent in Camzyos .

## 2024-09-20 NOTE — Patient Instructions (Signed)
 Medication Instructions:   NO CHANGES  *If you need a refill on your cardiac medications before your next appointment, please call your pharmacy*   Follow-Up: At [facility], you and your health needs are our priority.  As part of our continuing mission to provide you with exceptional heart care, our providers are all part of one team.  This team includes your primary Cardiologist (physician) and Advanced Practice Providers or APPs (Physician Assistants and Nurse Practitioners) who all work together to provide you with the care you need, when you need it.  Your next appointment:    4 months with Dr. Santo  We recommend signing up for the patient portal called MyChart.  Sign up information is provided on this After Visit Summary.  MyChart is used to connect with patients for Virtual Visits (Telemedicine).  Patients are able to view lab/test results, encounter notes, upcoming appointments, etc.  Non-urgent messages can be sent to your provider as well.   To learn more about what you can do with MyChart, go to [email or web address].

## 2024-09-20 NOTE — Progress Notes (Signed)
 Cardiology Office Note:  .    Date:  09/20/2024  ID:  Cody Flores, DOB 08-07-1966, MRN [id number] PCP: Alvia Bring, DO   HeartCare Providers Cardiologist:  None     CC: HCM f/u  History of Present Illness: .    Cody Flores is a 58 y.o. male with obstructive hypertrophic cardiomyopathy who presents for follow-up evaluation.  He has been managing obstructive hypertrophic cardiomyopathy and is currently on medication therapy. His recent echocardiogram showed an ejection fraction of 60%. The gradient was measured at 48 mmHg previously and is now 34 mmHg. He feels fine and has been able to engage in physical activities such as walking over 10,000 steps in a day without significant issues.  He is currently taking 10 mg of medication, which was increased from a previous dose due to insufficient reduction in gradient. He has not experienced any significant side effects from the medication and reports adequate energy levels for daily activities.  Family history reveals that his daughter had Hodgkin's lymphoma but has been in remission for five years. His sister and brother have been checked for heart conditions, with his brother being on medication for arrhythmia for 15 years.  Discussed the use of AI scribe software for clinical note transcription with the patient, who gave verbal consent to proceed.   Relevant histories: .  Social  - sudden cardiac death in cousin and uncle - no confirmed HCM - has one daughter, no HCM (Lymphoma in remission); sister and brother screened negative ROS: As per HPI.   Studies Reviewed: .     Cardiac Studies & Procedures   ______________________________________________________________________________________________     ECHOCARDIOGRAM  ECHOCARDIOGRAM COMPLETE 01/11/2024  Narrative ECHOCARDIOGRAM REPORT    Patient Name:   Cody Flores Date of Exam: 01/11/2024 Medical Rec #:  [id number]         Height:       70.0  in Accession #:    [id number]        Weight:       215.0 lb Date of Birth:  1966/02/17         BSA:          2.152 m Patient Age:    57 years          BP:           115/80 mmHg Patient Gender: M                 HR:           63 bpm. Exam Location:  [address]  Procedure: 2D Echo, Cardiac Doppler, Color Doppler, 3D Echo and Strain Analysis (Both Spectral and Color Flow Doppler were utilized during procedure).  Indications:     R06.02 SOB; R06.9 DOE; R01.1 Murmur  History:         Patient has prior history of Echocardiogram examinations. COPD; Risk Factors:Hypertension.  Sonographer:     Augustin Seals RDCS Referring Phys:  [id number] Cody Flores Diagnosing Phys: Soyla Merck MD   Sonographer Comments: Squat to Stand Provocation protocol ([facility] protocol) Patient was added on to Dr. Alveta (DOD) schedule today. IMPRESSIONS   1. Dynamic maneuvers performed to elicit LVOT gradient. 2. At rest, LVOT max instantaneous gradient 12 mmHg. With Valsalva, max instantaneous gradient 25 mmHg (HR 67 bpm). 3. With simply standing from a seated position, max instantaneous gradient is 54 mmHg (HR 84 bpm). MR increases to mild. 4. Wit repetitive squat to stand, LVOT max instantaneous  gradient 76 mmHg (HR 80 bpm). MR not well visualized, appears mild. 5. Left ventricular ejection fraction, by estimation, is 70 to 75%. The left ventricle has hyperdynamic function. The left ventricle has no regional wall motion abnormalities. There is moderate asymmetric left ventricular hypertrophy of the basal-septal segment (14-15 mm). Left ventricular diastolic parameters are consistent with Grade I diastolic dysfunction (impaired relaxation). The average left ventricular global longitudinal strain is -19.9 %. The global longitudinal strain is normal. 6. Right ventricular systolic function is normal. The right ventricular size is normal. Tricuspid regurgitation signal is inadequate for assessing PA pressure. 7.  The aortic valve is tricuspid. Aortic valve regurgitation is not visualized. No aortic stenosis is present. 8. Chordal SAM. The mitral valve is normal in structure. Trivial mitral valve regurgitation. No evidence of mitral stenosis. 9. The inferior vena cava is normal in size with greater than 50% respiratory variability, suggesting right atrial pressure of 3 mmHg.  FINDINGS Left Ventricle: Left ventricular ejection fraction, by estimation, is 70 to 75%. The left ventricle has hyperdynamic function. The left ventricle has no regional wall motion abnormalities. The average left ventricular global longitudinal strain is -19.9 %. Strain was performed and the global longitudinal strain is normal. 3D ejection fraction reviewed and evaluated as part of the interpretation. Alternate measurement of EF is felt to be most reflective of LV function. The left ventricular internal cavity size was normal in size. There is moderate asymmetric left ventricular hypertrophy of the basal-septal segment. Left ventricular diastolic parameters are consistent with Grade I diastolic dysfunction (impaired relaxation).  Right Ventricle: The right ventricular size is normal. No increase in right ventricular wall thickness. Right ventricular systolic function is normal. Tricuspid regurgitation signal is inadequate for assessing PA pressure.  Left Atrium: Left atrial size was normal in size.  Right Atrium: Right atrial size was normal in size.  Pericardium: There is no evidence of pericardial effusion.  Mitral Valve: Chordal SAM. The mitral valve is normal in structure. Trivial mitral valve regurgitation. No evidence of mitral valve stenosis.  Tricuspid Valve: The tricuspid valve is grossly normal. Tricuspid valve regurgitation is trivial. No evidence of tricuspid stenosis.  Aortic Valve: The aortic valve is tricuspid. Aortic valve regurgitation is not visualized. No aortic stenosis is present.  Pulmonic Valve: The  pulmonic valve was normal in structure. Pulmonic valve regurgitation is not visualized. No evidence of pulmonic stenosis.  Aorta: The aortic root is normal in size and structure.  Venous: The inferior vena cava is normal in size with greater than 50% respiratory variability, suggesting right atrial pressure of 3 mmHg.  IAS/Shunts: The interatrial septum was not well visualized.  Additional Comments: 3D was performed not requiring image post processing on an independent workstation and was indeterminate.   LEFT VENTRICLE PLAX 2D LVIDd:         4.35 cm   Diastology LVIDs:         2.45 cm   LV e' medial:    6.64 cm/s LV PW:         0.95 cm   LV E/e' medial:  13.2 LV IVS:        1.50 cm   LV e' lateral:   8.81 cm/s LVOT diam:     2.50 cm   LV E/e' lateral: 9.9 LV SV:         160 LV SV Index:   74        2D Longitudinal Strain LVOT Area:     4.91 cm  2D Strain GLS (A4C):   -20.5 % 2D Strain GLS (A3C):   -20.9 % 2D Strain GLS (A2C):   -18.4 % 2D Strain GLS Avg:     -19.9 %  3D Volume EF: 3D EF:        60 % LV EDV:       128 ml LV ESV:       51 ml LV SV:        77 ml  RIGHT VENTRICLE RV Basal diam:  3.90 cm RV Mid diam:    3.50 cm RV S prime:     14.10 cm/s TAPSE (M-mode): 2.8 cm  LEFT ATRIUM             Index        RIGHT ATRIUM           Index LA diam:        4.15 cm 1.93 cm/m   RA Area:     12.80 cm LA Vol (A2C):   38.6 ml 17.93 ml/m  RA Volume:   26.20 ml  12.17 ml/m LA Vol (A4C):   31.9 ml 14.82 ml/m LA Biplane Vol: 35.3 ml 16.40 ml/m AORTIC VALVE LVOT Vmax:   159.00 cm/s LVOT Vmean:  107.000 cm/s LVOT VTI:    0.326 m  AORTA Ao Root diam: 3.40 cm Ao Asc diam:  3.70 cm  MITRAL VALVE MV Area (PHT): 3.77 cm    SHUNTS MV Decel Time: 201 msec    Systemic VTI:  0.33 m MV E velocity: 87.50 cm/s  Systemic Diam: 2.50 cm MV A velocity: 98.00 cm/s MV E/A ratio:  0.89  Soyla Merck MD Electronically signed by Soyla Merck MD Signature Date/Time:  01/11/2024/9:52:01 AM    Final (Updated)    MONITORS  LONG TERM MONITOR (3-14 DAYS) 04/14/2024  Narrative   Patient had a minimum heart rate of 45 bpm, maximum heart rate of 138 bpm, and average heart rate of 68 bpm. Predominant underlying rhythm was sinus rhythm. Rare shows SVT. No NSVT or AF. Isolated PACs were rare (<1.0%). Isolated PVCs were rare (<1.0%). Triggered and diary events associated with sinus rhythm.  No malignant arrhythmias in the in the setting of HCM     CARDIAC MRI  MR CARDIAC MORPHOLOGY W WO CONTRAST 02/09/2024  Narrative CLINICAL DATA:  Cardiomyopathy, follow up  COMPARISON: Echo 01/11/24  EXAM: MR CARDIA MORPHOLOGY WITHOUT AND WITH CONTRAST; MR CARDIAC VELOCITY FLOW MAPPING  TECHNIQUE: The patient was scanned on a 1.5 Tesla Siemens magnet. A dedicated cardiac coil was used. Functional imaging was done using TrueFisp sequences. 2,3, and 4 chamber views were done to assess for RWMA's. Modified Simpson's rule using a short axis stack was used to calculate an ejection fraction on a dedicated work [employer]. The patient received 10mL GADAVIST  GADOBUTROL  1 MMOL/ML IV SOLN. After 10 minutes inversion recovery sequences were used to assess for infiltration and scar tissue. Phase contrast velocity encoded images obtained x 2.  This examination is tailored for evaluation cardiac anatomy and function and provides very limited assessment of noncardiac structures, which are accordingly not evaluated during interpretation. If there is clinical concern for extracardiac pathology, further evaluation with CT imaging should be considered.  FINDINGS: LEFT VENTRICLE:  Left ventricular chamber size: Normal.  Maximal wall thickness: 15 mm  Location: Basal septum  There is systolic anterior motion of the mitral valve. Flow dephasing suggests left ventricular outflow tract obstruction. Mitral regurgitation is seen.  No evidence  of left  ventricular apical aneurysm.  Findings are consistent with hypertrophic cardiomyopathy with obstruction. Morphologic subtype: Sigmoid subtype  Normal left ventricular systolic function.  LVEF = 62%  There are no regional wall motion abnormalities.  No myocardial edema, T2 = 47 msec  Normal first pass perfusion.  There is no post contrast delayed myocardial enhancement.  Normal T1 myocardial nulling kinetics suggest against a diagnosis of cardiac amyloidosis.  ECV = 25%  RIGHT VENTRICLE:  Normal right ventricular chamber size.  Normal right ventricular wall thickness.  Normal right ventricular systolic function.  RVEF = 51%  There are no regional wall motion abnormalities.  No post contrast delayed myocardial enhancement.  ATRIA:  Grossly normal.  PERICARDIUM:  Normal pericardium.  No pericardial effusion.  OTHER: No significant extracardiac findings. 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 Grossly similar appearance across modalities.  MEASUREMENTS: Qp/Qs: 0.92  VALVES:  Aortic valve regurgitation: Trivial, regurgitant fraction 1%  Pulmonary valve regurgitation: Trivial, regurgitant fraction 3%  Mitral valve regurgitation: Mild, regurgitant fraction 17.5%  Tricuspid valve regurgitation: Mild-moderate, regurgitant fraction 20.5%  Left ventricle:  LV male  LV EF: 62% (normal 49-79%)  Absolute volumes:  LV EDV: (Normal 95-215 mL)  LV ESV: 49mL (Normal 25-85 mL)  LV SV: 80mL (Normal 61-145 mL)  CO: 4.67L/min (Normal 3.4-7.8 L/min)  Indexed volumes:  CI: 2.17L/min/sq-m (Normal 1.8-4.2 L/min/sq-m)  LV EDV: 30mL/sq-m (Normal 50-108 mL/sq-m)  LV ESV: 43mL/sq-m (normal 11-47 mL/sq-m)  LV SV: 84mL/sq-m (Normal 33-72 mL/sq-m)  Right ventricle:  RV male  RV EF:  51% (normal 51-80%)  Absolute volumes:  RV EDV: (Normal  109-217 mL)  RV ESV: 74mL (Normal 23-91 mL)  RV SV: 78mL (Normal 71-141 mL)  CO: 4.6L/min (Normal 2.8-8.8 L/min)  Indexed volumes:  CI: 2.1L/min/sq-m (normal 1.7-4.2 L/min/sq-m)  RV EDV: 40mL/sq-m (Normal 58-109 mL/sq-m)  RV ESV: 37mL/sq-m (Normal 12-46 mL/sq-m)  RV SV: 37mL/sq-m (Normal 38-71 mL/sq-m)  IMPRESSION: 1. Findings most consistent with hypertrophic cardiomyopathy with obstruction, sigmoid subtype. 15 mm basal septum with systolic anterior motion of the mitral valve and flow dephasing suggesting LVOT obstruction. Mild mitral valve regurgitation, RF 17.5%.  2. No delayed myocardial enhancement. ECV normal range. No myocardial edema. Normal T1 myocardial nulling kinetics, no evidence of infiltrative process.  3. Normal biventricular chamber size and function, LVEF 62%, RVEF 51%.   Electronically Signed By: Soyla Merck M.D. On: 02/09/2024 15:02   ______________________________________________________________________________________________        Physical Exam:    VS:  BP 110/76   Pulse 84   Ht 5' 9 (1.753 m)   Wt 211 lb (95.7 kg)   SpO2 95%   BMI 31.16 kg/m    Wt Readings from Last 3 Encounters:  09/20/24 211 lb (95.7 kg)  06/23/24 201 lb (91.2 kg)  03/15/24 196 lb (88.9 kg)    Gen: no distress Cardiac: No Rubs or Gallops, Standing Valsalva Murmur, RRR +2 radial pulses Respiratory: Clear to auscultation bilaterally, normal effort, normal  respiratory rate GI: Soft, nontender, non-distended  MS: No  edema;  moves all extremities Integument: Skin feels warm Neuro:  At time of evaluation, alert and oriented to person/place/time/situation  Psych: Normal affect, patient feels ok     ASSESSMENT AND PLAN: .    Hypertrophic Cardiomyopathy - obstructive - peak gradient 34 on squat to stand echo  - with SAM associated MR - suspicion of Fabry's/Danon/Noonan's  or other mimics of HCM: low - Gene variant: Deferred - NYHA I  Family history  Reviewed, Discussed family screening  - 1st degree relatives (Brother, Daughter, Sister) screened negative with echo in 2024 and 2025  SCD  Assessment - SCD risk estimated to be 1.9%  at 5 years SDM: we have discussed 2A indication, we will monitor for ectopy or clinical change and consider ICD in the future.  Atrial fibrillation Assessment - HCM-AF score 17; no AF on 2024 monitor  Medication symptom plan Obstructive hypertrophic cardiomyopathy Ejection fraction of 60%. Current gradient is 34 mmHg, improved from 48 mmHg. Heart function is normal. He is on Mavacamten  therapy. He reports feeling well and is able to perform physical activities without significant limitations. Discussed potential for alcohol septal ablation, but he prefers to continue with medication therapy. Risks and benefits of alcohol septal ablation were discussed, including its definitive nature versus potential need for medication post-procedure. He is aware of the need for regular monitoring due to the abnormal actin myosin confirmation and the medication's role in normalizing it. Over 22,000 people are on this medication with no significant adverse events reported. - Continue Mavacamten  therapy at 10 mg. - Will schedule echocardiogram in 8 weeks to assess gradient and heart function. - If echocardiogram shows normal results, will transition to 81-month follow-up schedule. - Monitor for symptoms of decreased energy or activity limitations; will consider increasing Mavacamten  to 15 mg if needed. - Will discuss potential for alcohol septal ablation if medication therapy becomes insufficient.    Longitudinal care: The evaluation and management services provided today reflect the complexity inherent in caring for this patient, including the ongoing longitudinal relationship and management of multiple chronic conditions and/or the need for care coordination. The visit required a comprehensive assessment and management plan  tailored to the patient's unique needs Time was spent addressing not only the acute concerns but also the broader context of the patient's health, including preventive care, chronic disease management, and care coordination as appropriate.  Complex longitudinal is necessary for conditions including: oHCM SDM - defers SRT at this time and notes he really would never want myectomy.     Cody Leavens, MD [facility] Cardiologist [facility]  [address] [place], [address] [place], [place] [address] [phone]  5:26 PM

## 2024-09-26 ENCOUNTER — Encounter: Payer: Self-pay | Admitting: Internal Medicine

## 2024-09-27 ENCOUNTER — Telehealth: Payer: Self-pay | Admitting: Internal Medicine

## 2024-09-27 NOTE — Telephone Encounter (Signed)
 Pharmacy calling stating that they need pt recent Echo Results 11/25 sent to their portal in order for pt to receive medication mavacamten  (CAMZYOS ) 10 MG CAPS capsule . Please advise.

## 2024-09-27 NOTE — Telephone Encounter (Signed)
 Attempted to call [facility] and spoke with 2 representatives who could not provide how to upload echo to their portal.  Spoke with Dr Santo who reports in his RN's absence Sherran PARAS, supervisor and Holley I, supervisor are taking care of Camzyos  needs.  Will forward to [facility] and [place] for further assistance.

## 2024-09-28 NOTE — Telephone Encounter (Signed)
 See previous phone note. This has been addressed.

## 2024-10-06 ENCOUNTER — Other Ambulatory Visit: Payer: Self-pay | Admitting: Family Medicine

## 2024-10-06 ENCOUNTER — Other Ambulatory Visit (HOSPITAL_COMMUNITY): Payer: Self-pay

## 2024-10-06 ENCOUNTER — Other Ambulatory Visit: Payer: Self-pay

## 2024-10-06 DIAGNOSIS — E1165 Type 2 diabetes mellitus with hyperglycemia: Secondary | ICD-10-CM

## 2024-10-06 MED ORDER — RYBELSUS 3 MG PO TABS
3.0000 mg | ORAL_TABLET | Freq: Every day | ORAL | 0 refills | Status: AC
  Filled 2024-10-06: qty 90, 90d supply, fill #0

## 2024-10-06 MED FILL — Semaglutide Tab 3 MG: 3.0000 mg | ORAL | 30 days supply | Qty: 30 | Fill #0 | Status: CN

## 2024-11-02 ENCOUNTER — Ambulatory Visit

## 2024-11-02 ENCOUNTER — Other Ambulatory Visit: Payer: Self-pay

## 2024-11-02 VITALS — Ht 69.0 in | Wt 210.0 lb

## 2024-11-02 DIAGNOSIS — Z860100 Personal history of colon polyps, unspecified: Secondary | ICD-10-CM

## 2024-11-02 MED ORDER — NA SULFATE-K SULFATE-MG SULF 17.5-3.13-1.6 GM/177ML PO SOLN
1.0000 | Freq: Once | ORAL | 0 refills | Status: AC
  Filled 2024-11-02: qty 354, 1d supply, fill #0

## 2024-11-02 NOTE — Progress Notes (Signed)
 No egg or soy allergy known to patient  No issues known to pt with past sedation with any surgeries or procedures Patient denies ever being told they had issues or difficulty with intubation  No FH of Malignant Hyperthermia Pt is not on diet pills Pt is not on  home 02  Pt is not on blood thinners  Pt denies issues with constipation  No A fib or A flutter Have any cardiac testing pending--Echo on 11/17/2024 Pt can ambulate-INDEPENDENTLY Pt denies use of chewing tobacco Discussed diabetic I weight loss medication holds Discussed NSAID holds Checked BMI Pt instructed to use Singlecare.com or GoodRx for a price reduction on prep  Patient's chart reviewed by Norleen Schillings CNRA prior to previsit and patient appropriate for the [facility].  Pre visit completed and red dot placed by patient's name on their procedure day (on provider's schedule).

## 2024-11-09 ENCOUNTER — Telehealth (HOSPITAL_COMMUNITY): Payer: Self-pay | Admitting: *Deleted

## 2024-11-09 ENCOUNTER — Other Ambulatory Visit (HOSPITAL_COMMUNITY): Payer: Self-pay

## 2024-11-09 NOTE — Telephone Encounter (Signed)
 Left detailed instructions for stress echo.

## 2024-11-14 ENCOUNTER — Encounter: Admitting: Gastroenterology

## 2024-11-16 ENCOUNTER — Other Ambulatory Visit: Payer: Self-pay

## 2024-11-16 DIAGNOSIS — I421 Obstructive hypertrophic cardiomyopathy: Secondary | ICD-10-CM

## 2024-11-17 ENCOUNTER — Other Ambulatory Visit: Payer: Self-pay | Admitting: Internal Medicine

## 2024-11-17 ENCOUNTER — Ambulatory Visit (HOSPITAL_COMMUNITY)
Admission: RE | Admit: 2024-11-17 | Discharge: 2024-11-17 | Disposition: A | Discharge status: Home / Self Care | Source: Ambulatory Visit | Attending: Cardiology | Admitting: Cardiology

## 2024-11-17 DIAGNOSIS — I421 Obstructive hypertrophic cardiomyopathy: Secondary | ICD-10-CM | POA: Diagnosis present

## 2024-11-18 LAB — ECHOCARDIOGRAM STRESS TEST: Area-P 1/2: 3.14 cm2

## 2024-11-20 ENCOUNTER — Telehealth: Payer: Self-pay | Admitting: Internal Medicine

## 2024-11-20 DIAGNOSIS — I421 Obstructive hypertrophic cardiomyopathy: Secondary | ICD-10-CM

## 2024-11-20 NOTE — Telephone Encounter (Signed)
 Patient notes that he is doing well.   Since last visit notes no new medications. There are no interval hospital/ED visit.   Echo showed LVOT gradient of 10 mm Hg. LVEF 65% at rest (not reported in study).  No chest pain or pressure.  No SOB/DOE and no PND/Orthopnea.  No weight gain or leg swelling.  No palpitations or syncope .  Recommend: Mavacamten  10 mg Echo q 6months as per protocol F/u with Orren Fabry in 3 months F/u with me in 9 months.  Stanly Leavens, MD FASE [facility] Cardiologist [facility]  [address] [place], [place] [address] [phone]  1:55 PM

## 2024-11-21 MED ORDER — MAVACAMTEN 10 MG PO CAPS: 10.0000 mg | ORAL_CAPSULE | Freq: Every day | ORAL | 5 refills | Status: AC

## 2024-11-21 NOTE — Addendum Note (Signed)
 Addended by: Thanvi Blincoe D on: 11/21/2024 07:47 PM   Modules accepted: Orders

## 2024-11-21 NOTE — Telephone Encounter (Signed)
 Status form submitted. Refills sent.

## 2024-11-24 NOTE — Addendum Note (Signed)
 Addended by: RANDY HAMP SAILOR on: 11/24/2024 09:31 AM   Modules accepted: Orders

## 2024-12-01 ENCOUNTER — Encounter: Payer: Self-pay | Admitting: Gastroenterology

## 2024-12-19 ENCOUNTER — Ambulatory Visit: Admitting: Family Medicine

## 2025-01-09 ENCOUNTER — Encounter: Admitting: Gastroenterology

## 2025-05-02 ENCOUNTER — Ambulatory Visit (HOSPITAL_COMMUNITY)
